# Patient Record
Sex: Female | Born: 1940
Health system: Southern US, Community
[De-identification: ages and names within clinical notes are randomized; demographics above are authoritative.]

## PROBLEM LIST (undated history)

## (undated) DIAGNOSIS — M1990 Unspecified osteoarthritis, unspecified site: Secondary | ICD-10-CM

## (undated) DIAGNOSIS — F339 Major depressive disorder, recurrent, unspecified: Secondary | ICD-10-CM

## (undated) DIAGNOSIS — E039 Hypothyroidism, unspecified: Secondary | ICD-10-CM

## (undated) DIAGNOSIS — J302 Other seasonal allergic rhinitis: Secondary | ICD-10-CM

## (undated) DIAGNOSIS — H409 Unspecified glaucoma: Secondary | ICD-10-CM

## (undated) DIAGNOSIS — I10 Essential (primary) hypertension: Secondary | ICD-10-CM

## (undated) DIAGNOSIS — K219 Gastro-esophageal reflux disease without esophagitis: Secondary | ICD-10-CM

## (undated) DIAGNOSIS — J45909 Unspecified asthma, uncomplicated: Secondary | ICD-10-CM

## (undated) DIAGNOSIS — E785 Hyperlipidemia, unspecified: Secondary | ICD-10-CM

## (undated) DIAGNOSIS — R42 Dizziness and giddiness: Secondary | ICD-10-CM

## (undated) HISTORY — PX: THYROIDECTOMY: SHX17

## (undated) HISTORY — DX: Hyperlipidemia, unspecified: E78.5

## (undated) HISTORY — DX: Dizziness and giddiness: R42

## (undated) HISTORY — PX: MULTIPLE TOOTH EXTRACTIONS: SHX2053

## (undated) HISTORY — DX: Unspecified osteoarthritis, unspecified site: M19.90

## (undated) HISTORY — PX: ABDOMINAL HYSTERECTOMY: SHX81

## (undated) HISTORY — DX: Major depressive disorder, recurrent, unspecified: F33.9

## (undated) HISTORY — DX: Gastro-esophageal reflux disease without esophagitis: K21.9

---

## 1998-12-22 ENCOUNTER — Other Ambulatory Visit: Admission: RE | Admit: 1998-12-22 | Discharge: 1998-12-22 | Payer: Self-pay | Admitting: Gynecology

## 1999-07-03 ENCOUNTER — Encounter: Admission: RE | Admit: 1999-07-03 | Discharge: 1999-07-03 | Payer: Self-pay | Admitting: Gynecology

## 2000-01-02 ENCOUNTER — Encounter: Payer: Self-pay | Admitting: Gynecology

## 2000-01-02 ENCOUNTER — Encounter: Admission: RE | Admit: 2000-01-02 | Discharge: 2000-01-02 | Payer: Self-pay | Admitting: Gynecology

## 2000-02-26 ENCOUNTER — Other Ambulatory Visit: Admission: RE | Admit: 2000-02-26 | Discharge: 2000-02-26 | Payer: Self-pay | Admitting: Gynecology

## 2001-02-12 ENCOUNTER — Encounter: Admission: RE | Admit: 2001-02-12 | Discharge: 2001-02-12 | Payer: Self-pay | Admitting: Gynecology

## 2001-02-12 ENCOUNTER — Encounter: Payer: Self-pay | Admitting: Gynecology

## 2001-03-05 ENCOUNTER — Other Ambulatory Visit: Admission: RE | Admit: 2001-03-05 | Discharge: 2001-03-05 | Payer: Self-pay | Admitting: Gynecology

## 2001-09-08 ENCOUNTER — Emergency Department (HOSPITAL_COMMUNITY): Admission: EM | Admit: 2001-09-08 | Discharge: 2001-09-08 | Payer: Self-pay | Admitting: Emergency Medicine

## 2002-02-15 ENCOUNTER — Encounter: Admission: RE | Admit: 2002-02-15 | Discharge: 2002-02-15 | Payer: Self-pay | Admitting: Gynecology

## 2002-02-15 ENCOUNTER — Encounter: Payer: Self-pay | Admitting: Gynecology

## 2002-02-18 ENCOUNTER — Encounter: Payer: Self-pay | Admitting: Emergency Medicine

## 2002-02-18 ENCOUNTER — Emergency Department (HOSPITAL_COMMUNITY): Admission: EM | Admit: 2002-02-18 | Discharge: 2002-02-18 | Payer: Self-pay | Admitting: Emergency Medicine

## 2003-08-24 ENCOUNTER — Other Ambulatory Visit: Admission: RE | Admit: 2003-08-24 | Discharge: 2003-08-24 | Payer: Self-pay | Admitting: Gynecology

## 2003-09-02 ENCOUNTER — Encounter: Admission: RE | Admit: 2003-09-02 | Discharge: 2003-09-02 | Payer: Self-pay | Admitting: Gynecology

## 2003-09-06 ENCOUNTER — Encounter: Admission: RE | Admit: 2003-09-06 | Discharge: 2003-09-06 | Payer: Self-pay | Admitting: Gynecology

## 2003-11-12 ENCOUNTER — Emergency Department (HOSPITAL_COMMUNITY): Admission: EM | Admit: 2003-11-12 | Discharge: 2003-11-12 | Payer: Self-pay | Admitting: Emergency Medicine

## 2004-09-06 ENCOUNTER — Encounter: Admission: RE | Admit: 2004-09-06 | Discharge: 2004-09-06 | Payer: Self-pay | Admitting: Gynecology

## 2004-09-27 ENCOUNTER — Encounter: Admission: RE | Admit: 2004-09-27 | Discharge: 2004-09-27 | Payer: Self-pay | Admitting: Gynecology

## 2006-03-04 ENCOUNTER — Encounter: Admission: RE | Admit: 2006-03-04 | Discharge: 2006-03-04 | Payer: Self-pay | Admitting: Gynecology

## 2006-03-13 ENCOUNTER — Encounter: Admission: RE | Admit: 2006-03-13 | Discharge: 2006-03-13 | Payer: Self-pay | Admitting: Gynecology

## 2006-09-23 ENCOUNTER — Encounter: Admission: RE | Admit: 2006-09-23 | Discharge: 2006-09-23 | Payer: Self-pay

## 2007-03-27 ENCOUNTER — Encounter: Admission: RE | Admit: 2007-03-27 | Discharge: 2007-03-27 | Payer: Self-pay | Admitting: Internal Medicine

## 2008-04-22 ENCOUNTER — Encounter: Admission: RE | Admit: 2008-04-22 | Discharge: 2008-04-22 | Payer: Self-pay | Admitting: Internal Medicine

## 2009-05-09 ENCOUNTER — Encounter: Admission: RE | Admit: 2009-05-09 | Discharge: 2009-05-09 | Payer: Self-pay | Admitting: Internal Medicine

## 2010-05-06 ENCOUNTER — Encounter: Payer: Self-pay | Admitting: Gynecology

## 2010-05-16 ENCOUNTER — Encounter: Payer: Self-pay | Admitting: Internal Medicine

## 2010-05-28 ENCOUNTER — Other Ambulatory Visit (HOSPITAL_BASED_OUTPATIENT_CLINIC_OR_DEPARTMENT_OTHER): Payer: Self-pay | Admitting: Internal Medicine

## 2010-05-28 DIAGNOSIS — Z1231 Encounter for screening mammogram for malignant neoplasm of breast: Secondary | ICD-10-CM

## 2010-06-14 ENCOUNTER — Ambulatory Visit
Admission: RE | Admit: 2010-06-14 | Discharge: 2010-06-14 | Disposition: A | Discharge status: Home / Self Care | Payer: No Typology Code available for payment source | Source: Ambulatory Visit | Attending: Internal Medicine | Admitting: Internal Medicine

## 2010-06-14 DIAGNOSIS — Z1231 Encounter for screening mammogram for malignant neoplasm of breast: Secondary | ICD-10-CM

## 2010-10-12 DIAGNOSIS — E034 Atrophy of thyroid (acquired): Secondary | ICD-10-CM | POA: Diagnosis present

## 2010-10-12 DIAGNOSIS — I10 Essential (primary) hypertension: Secondary | ICD-10-CM | POA: Diagnosis present

## 2010-10-12 DIAGNOSIS — M1990 Unspecified osteoarthritis, unspecified site: Secondary | ICD-10-CM | POA: Insufficient documentation

## 2011-08-06 ENCOUNTER — Other Ambulatory Visit: Payer: Self-pay | Admitting: Internal Medicine

## 2011-08-06 DIAGNOSIS — Z1231 Encounter for screening mammogram for malignant neoplasm of breast: Secondary | ICD-10-CM

## 2011-08-23 ENCOUNTER — Ambulatory Visit: Payer: No Typology Code available for payment source

## 2011-09-17 ENCOUNTER — Ambulatory Visit
Admission: RE | Admit: 2011-09-17 | Discharge: 2011-09-17 | Disposition: A | Discharge status: Home / Self Care | Payer: No Typology Code available for payment source | Source: Ambulatory Visit | Attending: Internal Medicine | Admitting: Internal Medicine

## 2011-09-17 DIAGNOSIS — Z1231 Encounter for screening mammogram for malignant neoplasm of breast: Secondary | ICD-10-CM

## 2011-09-23 ENCOUNTER — Other Ambulatory Visit: Payer: Self-pay | Admitting: Internal Medicine

## 2011-09-23 DIAGNOSIS — R928 Other abnormal and inconclusive findings on diagnostic imaging of breast: Secondary | ICD-10-CM

## 2011-10-01 ENCOUNTER — Ambulatory Visit
Admission: RE | Admit: 2011-10-01 | Discharge: 2011-10-01 | Disposition: A | Discharge status: Home / Self Care | Payer: Medicare Other | Source: Ambulatory Visit | Attending: Internal Medicine | Admitting: Internal Medicine

## 2011-10-01 DIAGNOSIS — R928 Other abnormal and inconclusive findings on diagnostic imaging of breast: Secondary | ICD-10-CM

## 2012-11-26 ENCOUNTER — Other Ambulatory Visit: Payer: Self-pay

## 2012-11-26 DIAGNOSIS — Z1231 Encounter for screening mammogram for malignant neoplasm of breast: Secondary | ICD-10-CM

## 2012-12-10 ENCOUNTER — Ambulatory Visit
Admission: RE | Admit: 2012-12-10 | Discharge: 2012-12-10 | Disposition: A | Discharge status: Home / Self Care | Payer: Medicare Other | Source: Ambulatory Visit

## 2012-12-10 DIAGNOSIS — Z1231 Encounter for screening mammogram for malignant neoplasm of breast: Secondary | ICD-10-CM

## 2013-10-26 ENCOUNTER — Ambulatory Visit (INDEPENDENT_AMBULATORY_CARE_PROVIDER_SITE_OTHER): Payer: Medicare Other | Admitting: Podiatry

## 2013-10-26 ENCOUNTER — Encounter: Payer: Self-pay | Admitting: Podiatry

## 2013-10-26 VITALS — BP 141/73 | HR 64 | Ht 60.0 in | Wt 229.0 lb

## 2013-10-26 DIAGNOSIS — L600 Ingrowing nail: Secondary | ICD-10-CM

## 2013-10-26 DIAGNOSIS — M79605 Pain in left leg: Secondary | ICD-10-CM | POA: Insufficient documentation

## 2013-10-26 DIAGNOSIS — M79609 Pain in unspecified limb: Secondary | ICD-10-CM

## 2013-10-26 DIAGNOSIS — M79675 Pain in left toe(s): Secondary | ICD-10-CM

## 2013-10-26 DIAGNOSIS — L609 Nail disorder, unspecified: Secondary | ICD-10-CM

## 2013-10-26 DIAGNOSIS — L608 Other nail disorders: Secondary | ICD-10-CM

## 2013-10-26 NOTE — Progress Notes (Signed)
Subjective: Painful ingrown nail x 2-3 years on left lateral big toe. Patient request surgical correction of the toe nail.   Objective: Thick dystrophic nail with ingrown nail border left lateral. Neurovascular status are within normal. No gross deformities in osseous structures.   Assessment: Painful ingrown nail left hallux lateral border.  Plan: Reviewed findings and available treatment options. Patient requests surgical options to prevent further ingrown nail problems.   Procedure done: Phenol and Alcohol Matrixectomy left lateral border.  Affected toe was anesthetized with total 34ml mixture of 50/50 0.5% Marcaine plain and 1% Xylocaine plain. Then left foot was cleansed with Iodine solution. Affected nail border was reflected with a nail elevator and excised with nail nipper. Proximal nail matrix tissue was cauterized with Phenol soaked cotton applicator x 4 and neutralized with Alcohol soaked cotton applicator. The wound was dressed with Amerigel ointment dressing. Home care instructions and supply dispensed.  Return in 1 week for follow up.

## 2013-10-26 NOTE — Patient Instructions (Signed)
Ingrown nail surgery done left lateral border. Follow home care instructions as dispensed.  Return in 1 week for follow up.

## 2013-10-29 ENCOUNTER — Encounter: Payer: Self-pay | Admitting: Podiatry

## 2013-10-29 ENCOUNTER — Ambulatory Visit (INDEPENDENT_AMBULATORY_CARE_PROVIDER_SITE_OTHER): Payer: Medicare Other | Admitting: Podiatry

## 2013-10-29 VITALS — BP 145/73 | HR 74

## 2013-10-29 DIAGNOSIS — Z9889 Other specified postprocedural states: Secondary | ICD-10-CM

## 2013-10-29 NOTE — Progress Notes (Signed)
Status post nail surgery left lateral border of hallux. Still draining without edema or erythema. Informed to continue to soak till the area becomes dry and cover the area during the day.

## 2013-10-29 NOTE — Patient Instructions (Signed)
Doing well following nail surgery. Continue to soak till the area become dry.

## 2013-11-03 DIAGNOSIS — D126 Benign neoplasm of colon, unspecified: Secondary | ICD-10-CM | POA: Insufficient documentation

## 2014-01-28 ENCOUNTER — Encounter (HOSPITAL_COMMUNITY): Payer: Self-pay | Admitting: Emergency Medicine

## 2014-01-28 ENCOUNTER — Emergency Department (HOSPITAL_COMMUNITY): Payer: Medicare Other

## 2014-01-28 ENCOUNTER — Observation Stay (HOSPITAL_COMMUNITY)
Admission: EM | Admit: 2014-01-28 | Discharge: 2014-01-29 | Disposition: A | Discharge status: Home / Self Care | Payer: Medicare Other | Attending: Internal Medicine | Admitting: Internal Medicine

## 2014-01-28 DIAGNOSIS — S065XAA Traumatic subdural hemorrhage with loss of consciousness status unknown, initial encounter: Secondary | ICD-10-CM

## 2014-01-28 DIAGNOSIS — S066X0A Traumatic subarachnoid hemorrhage without loss of consciousness, initial encounter: Principal | ICD-10-CM | POA: Insufficient documentation

## 2014-01-28 DIAGNOSIS — Z79899 Other long term (current) drug therapy: Secondary | ICD-10-CM | POA: Insufficient documentation

## 2014-01-28 DIAGNOSIS — W108XXA Fall (on) (from) other stairs and steps, initial encounter: Secondary | ICD-10-CM | POA: Diagnosis not present

## 2014-01-28 DIAGNOSIS — I10 Essential (primary) hypertension: Secondary | ICD-10-CM | POA: Diagnosis present

## 2014-01-28 DIAGNOSIS — E039 Hypothyroidism, unspecified: Secondary | ICD-10-CM | POA: Diagnosis not present

## 2014-01-28 DIAGNOSIS — J4520 Mild intermittent asthma, uncomplicated: Secondary | ICD-10-CM

## 2014-01-28 DIAGNOSIS — S066X9A Traumatic subarachnoid hemorrhage with loss of consciousness of unspecified duration, initial encounter: Secondary | ICD-10-CM | POA: Diagnosis present

## 2014-01-28 DIAGNOSIS — S0003XA Contusion of scalp, initial encounter: Secondary | ICD-10-CM | POA: Diagnosis not present

## 2014-01-28 DIAGNOSIS — J45909 Unspecified asthma, uncomplicated: Secondary | ICD-10-CM | POA: Diagnosis not present

## 2014-01-28 DIAGNOSIS — Y9239 Other specified sports and athletic area as the place of occurrence of the external cause: Secondary | ICD-10-CM | POA: Diagnosis not present

## 2014-01-28 DIAGNOSIS — R42 Dizziness and giddiness: Secondary | ICD-10-CM | POA: Insufficient documentation

## 2014-01-28 DIAGNOSIS — S066XAA Traumatic subarachnoid hemorrhage with loss of consciousness status unknown, initial encounter: Secondary | ICD-10-CM | POA: Diagnosis present

## 2014-01-28 DIAGNOSIS — Y9301 Activity, walking, marching and hiking: Secondary | ICD-10-CM | POA: Diagnosis not present

## 2014-01-28 DIAGNOSIS — W19XXXA Unspecified fall, initial encounter: Secondary | ICD-10-CM

## 2014-01-28 DIAGNOSIS — S065X9A Traumatic subdural hemorrhage with loss of consciousness of unspecified duration, initial encounter: Secondary | ICD-10-CM

## 2014-01-28 DIAGNOSIS — E034 Atrophy of thyroid (acquired): Secondary | ICD-10-CM | POA: Diagnosis present

## 2014-01-28 DIAGNOSIS — E038 Other specified hypothyroidism: Secondary | ICD-10-CM

## 2014-01-28 HISTORY — DX: Essential (primary) hypertension: I10

## 2014-01-28 HISTORY — DX: Unspecified glaucoma: H40.9

## 2014-01-28 HISTORY — DX: Other seasonal allergic rhinitis: J30.2

## 2014-01-28 HISTORY — DX: Unspecified asthma, uncomplicated: J45.909

## 2014-01-28 HISTORY — DX: Hypothyroidism, unspecified: E03.9

## 2014-01-28 NOTE — ED Notes (Signed)
PA at the bedside.

## 2014-01-28 NOTE — ED Notes (Addendum)
Per EMS, Patient was walking up a grassy hill after the Football game, slipped, and fell backwards. Patient hit the posterior of her head on the cement. Patient reports pain with movement, nausea, and dizziness. Patient was not immobilized due to no signs of spinal injury. Patient awake and alert at arrival to the ED. Vitals per EMS: 174/58, 58-65 HR, NSR, 100% on RA, 124 CBG.

## 2014-01-28 NOTE — ED Provider Notes (Signed)
CSN: [id number]     Arrival date & time 01/28/14  2239 History   First MD Initiated Contact with Patient 01/28/14 2249     Chief Complaint  Patient presents with  . Fall     (Consider location/radiation/quality/duration/timing/severity/associated sxs/prior Treatment) The history is provided by the patient and medical records. No language interpreter was used.    Rebecca Hubbard is a 73 y.o. female  with a hx of HTN, glaucoma, hypothyroid, asthma presents to the Emergency Department complaining of gradual, persistent, progressively worsening dizziness (described as room spinning) onset 40min PTA immediately after falling and striking her head on the concrete. Pt reports she was at a high school football game, walking down a hill when she stepped off the sidewalk into the grass and slipped and fell. Associated symptoms include dizziness, mild headache, mild nausea.  Lying very still makes it better and movement makes it worse.  Pt denies changes in vision, fevers, chills, neck pain, chest pain shortness of breath, abdominal pain, vomiting, loss of consciousness.    PCP: [facility]  Past Medical History  Diagnosis Date  . Hypertension   . Glaucoma   . Hypothyroidism   . Seasonal allergies   . Asthma    Past Surgical History  Procedure Laterality Date  . Thyroidectomy    . Abdominal hysterectomy     Family History  Problem Relation Age of Onset  . Cancer Sister    History  Substance Use Topics  . Smoking status: Never Smoker   . Smokeless tobacco: Never Used  . Alcohol Use: 3.0 oz/week    5 Cans of beer per week   OB History   Grav Para Term Preterm Abortions TAB SAB Ect Mult Living                 Review of Systems  Constitutional: Negative for fever, diaphoresis, appetite change, fatigue and unexpected weight change.  HENT: Negative for mouth sores.   Eyes: Negative for visual disturbance.  Respiratory: Negative for cough, chest tightness, shortness  of breath and wheezing.   Cardiovascular: Negative for chest pain.  Gastrointestinal: Positive for nausea. Negative for vomiting, abdominal pain, diarrhea and constipation.  Endocrine: Negative for polydipsia, polyphagia and polyuria.  Genitourinary: Negative for dysuria, urgency, frequency and hematuria.  Musculoskeletal: Negative for back pain and neck stiffness.  Skin: Negative for rash.  Allergic/Immunologic: Negative for immunocompromised state.  Neurological: Positive for dizziness and headaches. Negative for syncope and light-headedness.  Hematological: Does not bruise/bleed easily.  Psychiatric/Behavioral: Negative for sleep disturbance. The patient is not nervous/anxious.       Allergies  Atorvastatin; Penicillins; Sulfa antibiotics; and Aspirin  Home Medications   Prior to Admission medications   Medication Sig Start Date End Date Taking? Authorizing Provider  albuterol (PROAIR HFA) 108 (90 BASE) MCG/ACT inhaler Inhale 2 puffs into the lungs every 6 (six) hours as needed for wheezing or shortness of breath.  05/30/10  Yes Historical Provider, MD  Bepotastine Besilate (BEPREVE) 1.5 % SOLN Place 1 drop into both eyes every morning. 1.5 % 08/10/08  Yes Historical Provider, MD  calcium carbonate (OS-CAL) 600 MG TABS tablet Take 600 mg by mouth daily.   Yes Historical Provider, MD  GELNIQUE 10 % GEL Apply 1 application topically daily as needed (going to the bathroom as often).  10/04/13  Yes Historical Provider, MD  latanoprost (XALATAN) 0.005 % ophthalmic solution Place 1 drop into both eyes at bedtime.  10/06/13  Yes Historical Provider,  MD  LEVOXYL 125 MCG tablet Take 125 mcg by mouth daily before breakfast.  09/09/13  Yes Historical Provider, MD  mometasone-formoterol (DULERA) 100-5 MCG/ACT AERO Inhale 2 puffs into the lungs 2 (two) times daily.   Yes Historical Provider, MD  triamterene-hydrochlorothiazide (DYAZIDE) 37.5-25 MG per capsule Take 1 capsule by mouth every morning.   07/29/13 07/29/14 Yes Historical Provider, MD  ZETIA 10 MG tablet Take 10 mg by mouth every morning.  10/04/13  Yes Historical Provider, MD   BP 169/74  Pulse 57  Temp(Src) 97.7 F (36.5 C) (Oral)  Resp 22  SpO2 100% Physical Exam  Nursing note and vitals reviewed. Constitutional: She is oriented to person, place, and time. She appears well-developed and well-nourished. No distress.  HENT:  Head: Normocephalic. Head is with contusion.    Right Ear: Tympanic membrane and ear canal normal. No hemotympanum.  Left Ear: Tympanic membrane and ear canal normal. No hemotympanum.  Nose: Nose normal.  Mouth/Throat: Uvula is midline, oropharynx is clear and moist and mucous membranes are normal.  Contusion to the posterior occiput without laceration, step-off or deformity  Eyes: Conjunctivae and EOM are normal. Pupils are equal, round, and reactive to light. No scleral icterus.  No horizontal, vertical or rotational nystagmus  Neck: Normal range of motion. Neck supple. No spinous process tenderness and no muscular tenderness present. No rigidity. Normal range of motion present.  Full active and passive ROM without pain No midline or paraspinal tenderness No nuchal rigidity or meningeal signs  Cardiovascular: Normal rate, regular rhythm, normal heart sounds and intact distal pulses.   No murmur heard. Pulses:      Radial pulses are 2+ on the right side, and 2+ on the left side.       Dorsalis pedis pulses are 2+ on the right side, and 2+ on the left side.       Posterior tibial pulses are 2+ on the right side, and 2+ on the left side.  Pulmonary/Chest: Effort normal and breath sounds normal. No accessory muscle usage. No respiratory distress. She has no decreased breath sounds. She has no wheezes. She has no rhonchi. She has no rales. She exhibits no tenderness and no bony tenderness.  No flail segment, crepitus or deformity Equal chest expansion  Abdominal: Soft. Normal appearance and bowel  sounds are normal. There is no tenderness. There is no rigidity, no rebound, no guarding and no CVA tenderness.  Abd soft and nontender  Musculoskeletal: Normal range of motion.       Thoracic back: She exhibits normal range of motion.       Lumbar back: She exhibits normal range of motion.  Full range of motion of the T-spine and L-spine No tenderness to palpation of the spinous processes of the T-spine or L-spine No tenderness to palpation of the paraspinous muscles of the L-spine  Lymphadenopathy:    She has no cervical adenopathy.  Neurological: She is alert and oriented to person, place, and time. She has normal reflexes. No cranial nerve deficit. She exhibits normal muscle tone. Coordination normal. GCS eye subscore is 4. GCS verbal subscore is 5. GCS motor subscore is 6.  Reflex Scores:      Tricep reflexes are 2+ on the right side and 2+ on the left side.      Bicep reflexes are 2+ on the right side and 2+ on the left side.      Brachioradialis reflexes are 2+ on the right side and 2+ on the  left side.      Patellar reflexes are 2+ on the right side and 2+ on the left side.      Achilles reflexes are 2+ on the right side and 2+ on the left side. Mental Status:  Alert, oriented, thought content appropriate. Speech fluent without evidence of aphasia. Able to follow 2 step commands without difficulty.  Cranial Nerves:  II:  Peripheral visual fields grossly normal, pupils equal, round, reactive to light III,IV, VI: ptosis not present, extra-ocular motions intact bilaterally  V,VII: smile symmetric, facial light touch sensation equal VIII: hearing grossly normal bilaterally  IX,X: gag reflex present  XI: bilateral shoulder shrug equal and strong XII: midline tongue extension  Motor:  5/5 in upper and lower extremities bilaterally including strong and equal grip strength and dorsiflexion/plantar flexion Sensory: Pinprick and light touch normal in all extremities.  Deep Tendon  Reflexes: 2+ and symmetric  Cerebellar: normal finger-to-nose with bilateral upper extremities Gait: Unable to gait test due to severe dizziness CV: distal pulses palpable throughout   Skin: Skin is warm and dry. No rash noted. She is not diaphoretic. No erythema.  Psychiatric: She has a normal mood and affect. Her behavior is normal. Judgment and thought content normal.    ED Course  Procedures (including critical care time) Labs Review Labs Reviewed  BASIC METABOLIC PANEL - Abnormal; Notable for the following:    Sodium 136 (*)    Potassium 3.6 (*)    Glucose, Bld 116 (*)    GFR calc non Af Amer 63 (*)    GFR calc Af Amer 73 (*)    All other components within normal limits  CBC  PROTIME-INR  APTT    Imaging Review Ct Head Wo Contrast  01/29/2014   CLINICAL DATA:  73 year old female post fall hitting head on cement. No history of loss of consciousness. Hypertension. Initial encounter.  EXAM: CT HEAD WITHOUT CONTRAST  CT CERVICAL SPINE WITHOUT CONTRAST  TECHNIQUE: Multidetector CT imaging of the head and cervical spine was performed following the standard protocol without intravenous contrast. Multiplanar CT image reconstructions of the cervical spine were also generated.  COMPARISON:  None.  FINDINGS: CT HEAD FINDINGS  Left parietal scalp small hematoma without underlying fracture.  Question tiny amount of subarachnoid blood right parietal lobe (series 202, image 22).  Partial volume averaging with the roof of the orbit (series 201, image 13)  No CT evidence of large acute infarct.  No intracranial mass lesion noted on this unenhanced exam.  CT CERVICAL SPINE FINDINGS  No cervical spine fracture.  Straightening of the cervical spine may be related to head position or muscle spasm. If ligamentous injury or cord injury were of high clinical concern, MR imaging could be obtained for further delineation.  Cervical spondylotic changes most notable C5-6 level.  Atherosclerotic type changes  aortic arch, great vessels and carotid bifurcation.  IMPRESSION: CT HEAD  Left parietal scalp small hematoma without underlying fracture.  Question tiny amount of subarachnoid blood right parietal lobe (series 202, image 22).  Partial volume averaging with the roof of the orbit (series 201, image 13)  CT CERVICAL SPINE  No cervical spine fracture.  Straightening of the cervical spine may be related to head position or muscle spasm.  Cervical spondylotic changes most notable C5-6 level.  These results were called by telephone at the time of interpretation on 01/29/2014 at 12:23 am to Dr. Abigail Butts , who verbally acknowledged these results.   Electronically Signed  By: Chauncey Cruel M.D.   On: 01/29/2014 00:25   Ct Cervical Spine Wo Contrast  01/29/2014   CLINICAL DATA:  73 year old female post fall hitting head on cement. No history of loss of consciousness. Hypertension. Initial encounter.  EXAM: CT HEAD WITHOUT CONTRAST  CT CERVICAL SPINE WITHOUT CONTRAST  TECHNIQUE: Multidetector CT imaging of the head and cervical spine was performed following the standard protocol without intravenous contrast. Multiplanar CT image reconstructions of the cervical spine were also generated.  COMPARISON:  None.  FINDINGS: CT HEAD FINDINGS  Left parietal scalp small hematoma without underlying fracture.  Question tiny amount of subarachnoid blood right parietal lobe (series 202, image 22).  Partial volume averaging with the roof of the orbit (series 201, image 13)  No CT evidence of large acute infarct.  No intracranial mass lesion noted on this unenhanced exam.  CT CERVICAL SPINE FINDINGS  No cervical spine fracture.  Straightening of the cervical spine may be related to head position or muscle spasm. If ligamentous injury or cord injury were of high clinical concern, MR imaging could be obtained for further delineation.  Cervical spondylotic changes most notable C5-6 level.  Atherosclerotic type changes aortic arch,  great vessels and carotid bifurcation.  IMPRESSION: CT HEAD  Left parietal scalp small hematoma without underlying fracture.  Question tiny amount of subarachnoid blood right parietal lobe (series 202, image 22).  Partial volume averaging with the roof of the orbit (series 201, image 13)  CT CERVICAL SPINE  No cervical spine fracture.  Straightening of the cervical spine may be related to head position or muscle spasm.  Cervical spondylotic changes most notable C5-6 level.  These results were called by telephone at the time of interpretation on 01/29/2014 at 12:23 am to Dr. Abigail Butts , who verbally acknowledged these results.   Electronically Signed   By: Chauncey Cruel M.D.   On: 01/29/2014 00:25     EKG Interpretation None      MDM   Final diagnoses:  Fall, initial encounter  Scalp contusion, initial encounter  Subarachnoid hemorrhage following injury, without loss of consciousness, initial encounter   [facility] presents with headache, mild nausea and severe dizziness (room spinning) after falling and striking her head.  Neurovascularly intact. Patient unable to walk due to severe dizziness. Concern for possible SAH; will CT scan.    1:20 AM CT with questionable tiny amount of subarachnoid blood right parietal lobe.  Increased suspicion of this due to patient symptoms.  Will consult with neurosurgery and plan for admission.   Patient with resolved nausea.  Increasing paraspinal neck pain at this time patient describes as tightness.  Patient continues to deny vision changes.  She reports her headache is resolved.  Dizziness is somewhat improved but returns when looking to the left or with movement   1:26 AM Discussed with Dr. Annette Stable who will evaluate in the morning. Pt to be admitted to medicine.    BP 169/74  Pulse 57  Temp(Src) 97.7 F (36.5 C) (Oral)  Resp 22  SpO2 100%  The patient was discussed with Dr. Lita Mains who agrees with the treatment plan.  2:03  AM Discussed with Dr. Arnoldo Morale who will admit to tele.     Jarrett Soho Jahmani Staup, PA-C 01/29/14 [phone]

## 2014-01-29 ENCOUNTER — Encounter (HOSPITAL_COMMUNITY): Payer: Self-pay | Admitting: Radiology

## 2014-01-29 ENCOUNTER — Observation Stay (HOSPITAL_COMMUNITY): Payer: Medicare Other

## 2014-01-29 DIAGNOSIS — J4520 Mild intermittent asthma, uncomplicated: Secondary | ICD-10-CM

## 2014-01-29 DIAGNOSIS — I10 Essential (primary) hypertension: Secondary | ICD-10-CM

## 2014-01-29 DIAGNOSIS — S0003XA Contusion of scalp, initial encounter: Secondary | ICD-10-CM

## 2014-01-29 DIAGNOSIS — W19XXXA Unspecified fall, initial encounter: Secondary | ICD-10-CM

## 2014-01-29 DIAGNOSIS — S066XAA Traumatic subarachnoid hemorrhage with loss of consciousness status unknown, initial encounter: Secondary | ICD-10-CM | POA: Diagnosis present

## 2014-01-29 DIAGNOSIS — S066X9A Traumatic subarachnoid hemorrhage with loss of consciousness of unspecified duration, initial encounter: Secondary | ICD-10-CM | POA: Diagnosis present

## 2014-01-29 DIAGNOSIS — E038 Other specified hypothyroidism: Secondary | ICD-10-CM

## 2014-01-29 DIAGNOSIS — S066X0A Traumatic subarachnoid hemorrhage without loss of consciousness, initial encounter: Secondary | ICD-10-CM

## 2014-01-29 LAB — CBC
HCT: 35.3 % — ABNORMAL LOW (ref 36.0–46.0)
HEMATOCRIT: 38.8 % (ref 36.0–46.0)
Hemoglobin: 13.6 g/dL (ref 12.0–15.0)
Hemoglobin: 12.6 g/dL (ref 12.0–15.0)
MCH: 30.3 pg (ref 26.0–34.0)
MCH: 30.7 pg (ref 26.0–34.0)
MCHC: 35.1 g/dL (ref 30.0–36.0)
MCHC: 35.7 g/dL (ref 30.0–36.0)
MCV: 86.4 fL (ref 78.0–100.0)
MCV: 86.1 fL (ref 78.0–100.0)
Platelets: 206 10*3/uL (ref 150–400)
Platelets: 205 10*3/uL (ref 150–400)
RBC: 4.49 MIL/uL (ref 3.87–5.11)
RBC: 4.1 MIL/uL (ref 3.87–5.11)
RDW: 13.2 % (ref 11.5–15.5)
RDW: 13.3 % (ref 11.5–15.5)
WBC: 8.2 10*3/uL (ref 4.0–10.5)
WBC: 7.1 10*3/uL (ref 4.0–10.5)

## 2014-01-29 LAB — APTT: APTT: 30 s (ref 24–37)

## 2014-01-29 LAB — BASIC METABOLIC PANEL
ANION GAP: 13 (ref 5–15)
Anion gap: 15 (ref 5–15)
BUN: 15 mg/dL (ref 6–23)
BUN: 13 mg/dL (ref 6–23)
CHLORIDE: 99 meq/L (ref 96–112)
CO2: 25 mEq/L (ref 19–32)
CO2: 26 mEq/L (ref 19–32)
Calcium: 9.1 mg/dL (ref 8.4–10.5)
Calcium: 8.7 mg/dL (ref 8.4–10.5)
Chloride: 96 mEq/L (ref 96–112)
Creatinine, Ser: 0.88 mg/dL (ref 0.50–1.10)
Creatinine, Ser: 0.84 mg/dL (ref 0.50–1.10)
GFR calc Af Amer: 73 mL/min — ABNORMAL LOW (ref >90)
GFR calc Af Amer: 77 mL/min — ABNORMAL LOW (ref >90)
GFR calc non Af Amer: 63 mL/min — ABNORMAL LOW (ref >90)
GFR calc non Af Amer: 66 mL/min — ABNORMAL LOW (ref >90)
GLUCOSE: 116 mg/dL — AB (ref 70–99)
Glucose, Bld: 98 mg/dL (ref 70–99)
POTASSIUM: 3.6 meq/L — AB (ref 3.7–5.3)
POTASSIUM: 3.7 meq/L (ref 3.7–5.3)
Sodium: 136 mEq/L — ABNORMAL LOW (ref 137–147)
Sodium: 138 mEq/L (ref 137–147)

## 2014-01-29 LAB — PROTIME-INR
INR: 1.2 (ref 0.00–1.49)
Prothrombin Time: 15.3 seconds — ABNORMAL HIGH (ref 11.6–15.2)

## 2014-01-29 MED ORDER — ONDANSETRON HCL 4 MG/2ML IJ SOLN: 4.0000 mg | Freq: Four times a day (QID) | INTRAMUSCULAR | Status: DC | PRN

## 2014-01-29 MED ORDER — MOMETASONE FURO-FORMOTEROL FUM 100-5 MCG/ACT IN AERO
2.0000 | INHALATION_SPRAY | Freq: Two times a day (BID) | RESPIRATORY_TRACT | Status: DC
  Administered 2014-01-29: 2 via RESPIRATORY_TRACT
  Filled 2014-01-29: qty 8.8

## 2014-01-29 MED ORDER — ONDANSETRON HCL 4 MG PO TABS: 4.0000 mg | ORAL_TABLET | Freq: Four times a day (QID) | ORAL | Status: DC | PRN

## 2014-01-29 MED ORDER — ONDANSETRON HCL 4 MG/2ML IJ SOLN: 4.0000 mg | Freq: Three times a day (TID) | INTRAMUSCULAR | Status: DC | PRN

## 2014-01-29 MED ORDER — ALUM & MAG HYDROXIDE-SIMETH 200-200-20 MG/5ML PO SUSP: 30.0000 mL | Freq: Four times a day (QID) | ORAL | Status: DC | PRN

## 2014-01-29 MED ORDER — SODIUM CHLORIDE 0.9 % IJ SOLN
3.0000 mL | Freq: Two times a day (BID) | INTRAMUSCULAR | Status: DC
  Administered 2014-01-29: 3 mL via INTRAVENOUS

## 2014-01-29 MED ORDER — CALCIUM CARBONATE 1250 (500 CA) MG PO TABS
1250.0000 mg | ORAL_TABLET | Freq: Every day | ORAL | Status: DC
  Administered 2014-01-29: 1250 mg via ORAL
  Filled 2014-01-29: qty 3

## 2014-01-29 MED ORDER — TRIAMTERENE-HCTZ 37.5-25 MG PO TABS
1.0000 | ORAL_TABLET | Freq: Every day | ORAL | Status: DC
Start: 2014-01-29 — End: 2014-01-29
  Administered 2014-01-29: 1 via ORAL
  Filled 2014-01-29: qty 1

## 2014-01-29 MED ORDER — SODIUM CHLORIDE 0.9 % IJ SOLN: 3.0000 mL | INTRAMUSCULAR | Status: DC | PRN

## 2014-01-29 MED ORDER — TRIAMTERENE-HCTZ 37.5-25 MG PO CAPS
1.0000 | ORAL_CAPSULE | Freq: Every day | ORAL | Status: DC
  Filled 2014-01-29: qty 1

## 2014-01-29 MED ORDER — SODIUM CHLORIDE 0.9 % IJ SOLN
3.0000 mL | Freq: Two times a day (BID) | INTRAMUSCULAR | Status: DC
  Administered 2014-01-29 (×2): 3 mL via INTRAVENOUS

## 2014-01-29 MED ORDER — LEVOTHYROXINE SODIUM 25 MCG PO TABS
125.0000 ug | ORAL_TABLET | Freq: Every day | ORAL | Status: DC
  Administered 2014-01-29: 125 ug via ORAL
  Filled 2014-01-29 (×2): qty 1

## 2014-01-29 MED ORDER — ACETAMINOPHEN 650 MG RE SUPP: 650.0000 mg | Freq: Four times a day (QID) | RECTAL | Status: DC | PRN

## 2014-01-29 MED ORDER — OXYCODONE HCL 5 MG PO TABS: 5.0000 mg | ORAL_TABLET | ORAL | Status: DC | PRN

## 2014-01-29 MED ORDER — ACETAMINOPHEN 325 MG PO TABS: 650.0000 mg | ORAL_TABLET | Freq: Four times a day (QID) | ORAL | Status: DC | PRN

## 2014-01-29 MED ORDER — OLOPATADINE HCL 0.1 % OP SOLN
1.0000 [drp] | Freq: Two times a day (BID) | OPHTHALMIC | Status: DC
  Administered 2014-01-29: 1 [drp] via OPHTHALMIC
  Filled 2014-01-29: qty 5

## 2014-01-29 MED ORDER — EZETIMIBE 10 MG PO TABS
10.0000 mg | ORAL_TABLET | Freq: Every day | ORAL | Status: DC
  Administered 2014-01-29: 10 mg via ORAL
  Filled 2014-01-29: qty 1

## 2014-01-29 MED ORDER — LATANOPROST 0.005 % OP SOLN
1.0000 [drp] | Freq: Every day | OPHTHALMIC | Status: DC
  Filled 2014-01-29: qty 2.5

## 2014-01-29 MED ORDER — SODIUM CHLORIDE 0.9 % IV SOLN: 250.0000 mL | INTRAVENOUS | Status: DC | PRN

## 2014-01-29 MED ORDER — ALBUTEROL SULFATE (2.5 MG/3ML) 0.083% IN NEBU: 3.0000 mL | INHALATION_SOLUTION | Freq: Four times a day (QID) | RESPIRATORY_TRACT | Status: DC | PRN

## 2014-01-29 MED ORDER — HYDROMORPHONE HCL 1 MG/ML IJ SOLN: 0.5000 mg | INTRAMUSCULAR | Status: DC | PRN

## 2014-01-29 NOTE — ED Provider Notes (Signed)
Medical screening examination/treatment/procedure(s) were performed by non-physician practitioner and as supervising physician I was immediately available for consultation/collaboration.   EKG Interpretation None        Julianne Rice, MD 01/29/14 [phone]

## 2014-01-29 NOTE — Progress Notes (Signed)
1550- discharge instructions and prescription given to pt. Pt verbally acknowledged understanding. Pt voiced no questions when prompted. Pt to be transported home by private car by daughter. Nurse to transport pt down to lobby per wheelchair.  No distress noted.    Rebecca Hubbard I 01/29/2014 4:04 PM

## 2014-01-29 NOTE — H&P (Signed)
[facility] Admission History and Physical       [name] [facility] KPT:[id number] DOB: 01-26-1941 DOA: 01/28/2014  Referring physician:  EDP PCP: Margaretmary Eddy, MD  Specialists:   Chief Complaint:  [place] and Hit head  HPI: Rebecca Hubbard is a 73 y.o. female with a history of HTN, Asthma, and Glaucoma  Was walking down the steps of the bleachers at a local high school football game and went from the last step onto the grass and slipped and fell back hitting her head on the concrete steps.   She did not lose consciousness.  She was brought to the ED and a CT scan was performed of the head and C-spine which revealed a small SAH, but no fractures.   Neuro surgery Dr Trenton Gammon was called by the EDP and recommended observation and a  repeat CT scan of the head in the AM and he will see her in the AM.  She was referred for medical admission.      Review of Systems:  Constitutional: No Weight Loss, No Weight Gain, Night Sweats, Fevers, Chills, Dizziness, Fatigue, or Generalized Weakness HEENT:  +Headache, Difficulty Swallowing,Tooth/Dental Problems,Sore Throat,  No Sneezing, Rhinitis, Ear Ache, Nasal Congestion, or Post Nasal Drip,  Cardio-vascular:  No Chest pain, Orthopnea, PND, Edema in Lower Extremities, Anasarca, Dizziness, Palpitations  Resp: No Dyspnea, No DOE, No Productive Cough, No Non-Productive Cough, No Hemoptysis, No Wheezing.    GI: No Heartburn, Indigestion, Abdominal Pain, Nausea, Vomiting, Diarrhea, Hematemesis, Hematochezia, Melena, Change in Bowel Habits,  Loss of Appetite  GU: No Dysuria, Change in Color of Urine, No Urgency or Frequency, No Flank pain.  Musculoskeletal: No Joint Pain or Swelling, No Decreased Range of Motion, No Back Pain.  Neurologic: No Syncope, No Seizures, Muscle Weakness, Paresthesia, Vision Disturbance or Loss, No Diplopia, No Vertigo, No Difficulty Walking,  Skin: No Rash or Lesions. Psych: No Change in Mood or Affect, No Depression  or Anxiety, No Memory loss, No Confusion, or Hallucinations   Past Medical History  Diagnosis Date  . Hypertension   . Glaucoma   . Hypothyroidism   . Seasonal allergies   . Asthma       Past Surgical History  Procedure Laterality Date  . Thyroidectomy    . Abdominal hysterectomy         Prior to Admission medications   Medication Sig Start Date End Date Taking? Authorizing Provider  albuterol (PROAIR HFA) 108 (90 BASE) MCG/ACT inhaler Inhale 2 puffs into the lungs every 6 (six) hours as needed for wheezing or shortness of breath.  05/30/10  Yes Historical Provider, MD  Bepotastine Besilate (BEPREVE) 1.5 % SOLN Place 1 drop into both eyes every morning. 1.5 % 08/10/08  Yes Historical Provider, MD  calcium carbonate (OS-CAL) 600 MG TABS tablet Take 600 mg by mouth daily.   Yes Historical Provider, MD  GELNIQUE 10 % GEL Apply 1 application topically daily as needed (going to the bathroom as often).  10/04/13  Yes Historical Provider, MD  latanoprost (XALATAN) 0.005 % ophthalmic solution Place 1 drop into both eyes at bedtime.  10/06/13  Yes Historical Provider, MD  LEVOXYL 125 MCG tablet Take 125 mcg by mouth daily before breakfast.  09/09/13  Yes Historical Provider, MD  mometasone-formoterol (DULERA) 100-5 MCG/ACT AERO Inhale 2 puffs into the lungs 2 (two) times daily.   Yes Historical Provider, MD  triamterene-hydrochlorothiazide (DYAZIDE) 37.5-25 MG per capsule Take 1 capsule by mouth every morning.  07/29/13 07/29/14 Yes Historical  Provider, MD  ZETIA 10 MG tablet Take 10 mg by mouth every morning.  10/04/13  Yes Historical Provider, MD      Allergies  Allergen Reactions  . Atorvastatin Other (See Comments)  . Penicillins Hives  . Sulfa Antibiotics Other (See Comments)    Other Reaction: Other reaction  . Aspirin Rash     Social History:  reports that she has never smoked. She has never used smokeless tobacco. She reports that she drinks about 3 ounces of alcohol per week.  She reports that she does not use illicit drugs.     Family History  Problem Relation Age of Onset  . Cancer Sister        Physical Exam:  GEN:  Pleasant Elderly  73 y.o. [place] American female  examined  and in no acute distress; cooperative with exam Filed Vitals:   01/29/14 0215 01/29/14 0325 01/29/14 0400 01/29/14 0600  BP: 153/77 166/52 179/53 139/44  Pulse: 59 58 58 60  Temp:  97.6 F (36.4 C)    TempSrc:  Oral    Resp: 14 18 18 16   Height:  5' (1.524 m)    Weight:  103.148 kg (227 lb 6.4 oz)    SpO2: 99% 99% 99%    Blood pressure 139/44, pulse 60, temperature 97.6 F (36.4 C), temperature source Oral, resp. rate 16, height 5' (1.524 m), weight 103.148 kg (227 lb 6.4 oz), SpO2 99.00%. PSYCH: SHe is alert and oriented x4; does not appear anxious does not appear depressed; affect is normal HEENT: Normocephalic and Atraumatic + Tenderness on Left Posterior Scalp area, Mucous membranes pink; PERRLA; EOM intact; Fundi:  Benign;  No scleral icterus, Nares: Patent, Oropharynx: Clear, Fair Dentition,    Neck:  FROM, No Cervical Lymphadenopathy nor Thyromegaly or Carotid Bruit; No JVD; Breasts:: Not examined CHEST WALL: No tenderness CHEST: Normal respiration, clear to auscultation bilaterally HEART: Regular rate and rhythm; no murmurs rubs or gallops BACK: No kyphosis or scoliosis; No CVA tenderness ABDOMEN: Positive Bowel Sounds, Obese, Soft Non-Tender; No Masses, No Organomegaly. Rectal Exam: Not done EXTREMITIES: No Cyanosis, Clubbing, or Edema; No Ulcerations. Genitalia: not examined PULSES: 2+ and symmetric SKIN: Normal hydration no rash or ulceration  CNS:   Mental Status:  Alert, Oriented, Thought Content Appropriate. Speech Fluent without evidence of Aphasia. Able to follow 3 step commands without difficulty.  In No obvious pain.   Cranial Nerves:  II: Discs flat bilaterally; Visual fields Intact, Pupils equal and reactive.    III,IV, VI: Extra-ocular motions  intact bilaterally    V,VII: smile symmetric, facial light touch sensation normal bilaterally    VIII: hearing intact or decreasesd bilaterally    IX,X: gag reflex present    XI: bilateral shoulder shrug    XII: midline tongue extension   Motor:  Right:  Upper extremity 5/5     Left:  Upper extremity 5/5     Right:  Lower extremity 5/5    Left:  Lower extremity 5/5     Tone and Bulk:  normal tone throughout; no atrophy noted   Sensory:  Pinprick and light touch intact throughout, bilaterally   Deep Tendon Reflexes: 2+ and symmetric throughout   Plantars/ Babinski:  Right: normal Left: normal    Cerebellar:  Finger to nose without difficulty.   Gait: deferred   Vascular: pulses palpable throughout    Labs on Admission:  Basic Metabolic Panel:  Recent Labs Lab 01/29/14 0103 01/29/14 0444  NA 136* 138  K 3.6* 3.7  CL 96 99  CO2 25 26  GLUCOSE 116* 98  BUN 15 13  CREATININE 0.88 0.84  CALCIUM 9.1 8.7   Liver Function Tests: No results found for this basename: AST, ALT, ALKPHOS, BILITOT, PROT, ALBUMIN,  in the last 168 hours No results found for this basename: LIPASE, AMYLASE,  in the last 168 hours No results found for this basename: AMMONIA,  in the last 168 hours CBC:  Recent Labs Lab 01/29/14 0103 01/29/14 0444  WBC 8.2 7.1  HGB 13.6 12.6  HCT 38.8 35.3*  MCV 86.4 86.1  PLT 206 205   Cardiac Enzymes: No results found for this basename: CKTOTAL, CKMB, CKMBINDEX, TROPONINI,  in the last 168 hours  BNP (last 3 results) No results found for this basename: PROBNP,  in the last 8760 hours CBG: No results found for this basename: GLUCAP,  in the last 168 hours  Radiological Exams on Admission: Ct Head Wo Contrast  01/29/2014   CLINICAL DATA:  73 year old female post fall hitting head on cement. No history of loss of consciousness. Hypertension. Initial encounter.  EXAM: CT HEAD WITHOUT CONTRAST  CT CERVICAL SPINE WITHOUT CONTRAST  TECHNIQUE: Multidetector CT  imaging of the head and cervical spine was performed following the standard protocol without intravenous contrast. Multiplanar CT image reconstructions of the cervical spine were also generated.  COMPARISON:  None.  FINDINGS: CT HEAD FINDINGS  Left parietal scalp small hematoma without underlying fracture.  Question tiny amount of subarachnoid blood right parietal lobe (series 202, image 22).  Partial volume averaging with the roof of the orbit (series 201, image 13)  No CT evidence of large acute infarct.  No intracranial mass lesion noted on this unenhanced exam.  CT CERVICAL SPINE FINDINGS  No cervical spine fracture.  Straightening of the cervical spine may be related to head position or muscle spasm. If ligamentous injury or cord injury were of high clinical concern, MR imaging could be obtained for further delineation.  Cervical spondylotic changes most notable C5-6 level.  Atherosclerotic type changes aortic arch, great vessels and carotid bifurcation.  IMPRESSION: CT HEAD  Left parietal scalp small hematoma without underlying fracture.  Question tiny amount of subarachnoid blood right parietal lobe (series 202, image 22).  Partial volume averaging with the roof of the orbit (series 201, image 13)  CT CERVICAL SPINE  No cervical spine fracture.  Straightening of the cervical spine may be related to head position or muscle spasm.  Cervical spondylotic changes most notable C5-6 level.  These results were called by telephone at the time of interpretation on 01/29/2014 at 12:23 am to Dr. Abigail Butts , who verbally acknowledged these results.   Electronically Signed   By: Chauncey Cruel M.D.   On: 01/29/2014 00:25   Ct Cervical Spine Wo Contrast  01/29/2014   CLINICAL DATA:  73 year old female post fall hitting head on cement. No history of loss of consciousness. Hypertension. Initial encounter.  EXAM: CT HEAD WITHOUT CONTRAST  CT CERVICAL SPINE WITHOUT CONTRAST  TECHNIQUE: Multidetector CT imaging of  the head and cervical spine was performed following the standard protocol without intravenous contrast. Multiplanar CT image reconstructions of the cervical spine were also generated.  COMPARISON:  None.  FINDINGS: CT HEAD FINDINGS  Left parietal scalp small hematoma without underlying fracture.  Question tiny amount of subarachnoid blood right parietal lobe (series 202, image 22).  Partial volume averaging with the roof of the orbit (series 201, image 13)  No CT evidence of large acute infarct.  No intracranial mass lesion noted on this unenhanced exam.  CT CERVICAL SPINE FINDINGS  No cervical spine fracture.  Straightening of the cervical spine may be related to head position or muscle spasm. If ligamentous injury or cord injury were of high clinical concern, MR imaging could be obtained for further delineation.  Cervical spondylotic changes most notable C5-6 level.  Atherosclerotic type changes aortic arch, great vessels and carotid bifurcation.  IMPRESSION: CT HEAD  Left parietal scalp small hematoma without underlying fracture.  Question tiny amount of subarachnoid blood right parietal lobe (series 202, image 22).  Partial volume averaging with the roof of the orbit (series 201, image 13)  CT CERVICAL SPINE  No cervical spine fracture.  Straightening of the cervical spine may be related to head position or muscle spasm.  Cervical spondylotic changes most notable C5-6 level.  These results were called by telephone at the time of interpretation on 01/29/2014 at 12:23 am to Dr. Abigail Butts , who verbally acknowledged these results.   Electronically Signed   By: Chauncey Cruel M.D.   On: 01/29/2014 00:25      Assessment/Plan:   73 y.o. female with  Principal Problem:   1.   Subarachnoid hemorrhage following injury- due to Fall   Observation, and Cardiac Monitoring   Neuro checks   Repeat CT scan of the Head in AM   Neuro surgery Dr. Trenton Gammon to see in AM.      Pain Control PRN   Fall  Precautions    Active Problems:   2.   Scalp contusion - due to Fall   Pain Control PRN        3.   Mechanical Fall   Fall Precautions     4.   Essential hypertension, benign   Continue HCTZ daily   Monitor BPs     5.   Extrinsic asthma   Continue Inhalers PRN     6.   Hypothyroidism   Continue Levoxyl   Check TSH Level.       7.   DVT Prophylaxis   SCDs    Code Status:   FULL CODE    Family Communication:    Daughter at Bedside Disposition Plan:      Observation   Time spent:  [place] [facility] Pager [phone]   If [place] Please Contact the Day Rounding Team MD for [facility]  If 7PM-7AM, Please Contact Night-Floor Coverage  [email or web address] Password [email or web address] 01/29/2014, 6:22 AM

## 2014-01-29 NOTE — ED Notes (Signed)
PA [place] at  bedside.

## 2014-01-29 NOTE — Discharge Summary (Signed)
Physician Discharge Summary  Evonne Rinks [facility] JXB:[id number] DOB: February 17, 1941 DOA: 01/28/2014  PCP: Margaretmary Eddy, MD  Admit date: 01/28/2014 Discharge date: 01/29/2014  Time spent: 35 minutes  Recommendations for Outpatient Follow-up:  1. Patient admitted after having a fall that resulted in head injury. Initial CT scan of head showed a possible tiny subarachnoid bleed, with repeat CT on the following day not well seen as reported by radiology. Patient neurologically stable.  2. Patient instructed to follow up with her PCP in 1 week.   Discharge Diagnoses:  Principal Problem:   Subarachnoid hemorrhage following injury Active Problems:   Scalp contusion   Essential hypertension, benign   Extrinsic asthma   Hypothyroidism   Discharge Condition: Stable  Diet recommendation: Heart Healthy  Filed Weights   01/29/14 0325  Weight: 103.148 kg (227 lb 6.4 oz)    History of present illness:  Rebecca Hubbard is a 73 y.o. female with a history of HTN, Asthma, and Glaucoma Was walking down the steps of the bleachers at a local high school football game and went from the last step onto the grass and slipped and fell back hitting her head on the concrete steps. She did not lose consciousness. She was brought to the ED and a CT scan was performed of the head and C-spine which revealed a small SAH, but no fractures. Neuro surgery Dr Trenton Gammon was called by the EDP and recommended observation and a repeat CT scan of the head in the AM and he will see her in the AM. She was referred for medical admission.   Hospital Course:  Patient is a pleasant 73-year-old female with a past medical history of hypertension, who was in her usual state of health having a fall while walking down the steps of the bleachers at a local high school football game. She hit the back of her head on concrete steps, there was no loss of consciousness.in the emergency room a CT scan of head showed a questionable tiny  amount of subarachnoid blood in the parietal lobe. She was kept overnight for observation. Patient remained neurologically intact with repeat CT scan of head on the following day remaining stable. Radiology reported that the possible focus of subarachnoid hemorrhage in the right parietal lobe was not well seen on repeat imaging. Given clinical stability she was discharged to her home on 01/29/2014.   Discharge Exam: Filed Vitals:   01/29/14 1414  BP: 137/53  Pulse: 50  Temp: 97.7 F (36.5 C)  Resp: 18    General: patient is in no acute distress, awake and oriented, states feeling better, asking to go home Cardiovascular: Regular rate and rhythm normal S1-S2 no murmurs rubs or gallops Respiratory: Normal respiratory effort, lungs are clear to auscultation bilateral Neurological: Patient is awake alert and oriented x3, global 5 of 5 muscle strength no alteration to sensation  Discharge Instructions You were cared for by a hospitalist during your hospital stay. If you have any questions about your discharge medications or the care you received while you were in the hospital after you are discharged, you can call the unit and asked to speak with the hospitalist on call if the hospitalist that took care of you is not available. Once you are discharged, your primary care physician will handle any further medical issues. Please note that NO REFILLS for any discharge medications will be authorized once you are discharged, as it is imperative that you return to your primary care physician (or establish a relationship with  a primary care physician if you do not have one) for your aftercare needs so that they can reassess your need for medications and monitor your lab values.  Discharge Instructions   Call MD for:  difficulty breathing, headache or visual disturbances    Complete by:  As directed      Call MD for:  extreme fatigue    Complete by:  As directed      Call MD for:  hives    Complete by:   As directed      Call MD for:  persistant dizziness or light-headedness    Complete by:  As directed      Call MD for:  persistant nausea and vomiting    Complete by:  As directed      Call MD for:  redness, tenderness, or signs of infection (pain, swelling, redness, odor or green/yellow discharge around incision site)    Complete by:  As directed      Call MD for:  severe uncontrolled pain    Complete by:  As directed      Call MD for:  temperature >100.4    Complete by:  As directed      Diet - low sodium heart healthy    Complete by:  As directed      Increase activity slowly    Complete by:  As directed           Current Discharge Medication List    START taking these medications   Details  acetaminophen (TYLENOL) 325 MG tablet Take 2 tablets (650 mg total) by mouth every 6 (six) hours as needed for mild pain (or Fever >/= 101). Qty: 30 tablet, Refills: 0    ondansetron (ZOFRAN) 4 MG tablet Take 1 tablet (4 mg total) by mouth every 6 (six) hours as needed for nausea. Qty: 20 tablet, Refills: 0      CONTINUE these medications which have NOT CHANGED   Details  albuterol (PROAIR HFA) 108 (90 BASE) MCG/ACT inhaler Inhale 2 puffs into the lungs every 6 (six) hours as needed for wheezing or shortness of breath.     Bepotastine Besilate (BEPREVE) 1.5 % SOLN Place 1 drop into both eyes every morning. 1.5 %    calcium carbonate (OS-CAL) 600 MG TABS tablet Take 600 mg by mouth daily.    GELNIQUE 10 % GEL Apply 1 application topically daily as needed (going to the bathroom as often).     latanoprost (XALATAN) 0.005 % ophthalmic solution Place 1 drop into both eyes at bedtime.     LEVOXYL 125 MCG tablet Take 125 mcg by mouth daily before breakfast.     mometasone-formoterol (DULERA) 100-5 MCG/ACT AERO Inhale 2 puffs into the lungs 2 (two) times daily.    triamterene-hydrochlorothiazide (DYAZIDE) 37.5-25 MG per capsule Take 1 capsule by mouth every morning.     ZETIA 10 MG  tablet Take 10 mg by mouth every morning.        Allergies  Allergen Reactions  . Atorvastatin Other (See Comments)  . Penicillins Hives  . Sulfa Antibiotics Other (See Comments)    Other Reaction: Other reaction  . Aspirin Rash   Follow-up Information   Follow up with PATERSON,ROBERT, MD In 1 week.   Specialty:  Internal Medicine   Contact information:   [address] [place] [address] [phone]        The results of significant diagnostics from this hospitalization (including imaging, microbiology, ancillary and  laboratory) are listed below for reference.    Significant Diagnostic Studies: Ct Head Wo Contrast  01/29/2014   CLINICAL DATA:  Subdural hemorrhage.  EXAM: CT HEAD WITHOUT CONTRAST  TECHNIQUE: Contiguous axial images were obtained from the base of the skull through the vertex without intravenous contrast.  COMPARISON:  Head CT scan 01/28/2014.  FINDINGS: Possible small focus of subarachnoid hemorrhage in the right parietal lobe seen on the comparison study is less conspicuous. No new hemorrhage is identified. There is no evidence of acute infarction, mass lesion, mass effect, midline shift or abnormal extra-axial fluid collection. No hydrocephalus or pneumocephalus. The calvarium is intact.  IMPRESSION: Possible small focus of subarachnoid hemorrhage in the right parietal lobe on yesterday's examination is not well seen today. No acute finding is identified.   Electronically Signed   By: Inge Rise M.D.   On: 01/29/2014 14:06   Ct Head Wo Contrast  01/29/2014   CLINICAL DATA:  73 year old female post fall hitting head on cement. No history of loss of consciousness. Hypertension. Initial encounter.  EXAM: CT HEAD WITHOUT CONTRAST  CT CERVICAL SPINE WITHOUT CONTRAST  TECHNIQUE: Multidetector CT imaging of the head and cervical spine was performed following the standard protocol without intravenous contrast. Multiplanar CT image reconstructions of  the cervical spine were also generated.  COMPARISON:  None.  FINDINGS: CT HEAD FINDINGS  Left parietal scalp small hematoma without underlying fracture.  Question tiny amount of subarachnoid blood right parietal lobe (series 202, image 22).  Partial volume averaging with the roof of the orbit (series 201, image 13)  No CT evidence of large acute infarct.  No intracranial mass lesion noted on this unenhanced exam.  CT CERVICAL SPINE FINDINGS  No cervical spine fracture.  Straightening of the cervical spine may be related to head position or muscle spasm. If ligamentous injury or cord injury were of high clinical concern, MR imaging could be obtained for further delineation.  Cervical spondylotic changes most notable C5-6 level.  Atherosclerotic type changes aortic arch, great vessels and carotid bifurcation.  IMPRESSION: CT HEAD  Left parietal scalp small hematoma without underlying fracture.  Question tiny amount of subarachnoid blood right parietal lobe (series 202, image 22).  Partial volume averaging with the roof of the orbit (series 201, image 13)  CT CERVICAL SPINE  No cervical spine fracture.  Straightening of the cervical spine may be related to head position or muscle spasm.  Cervical spondylotic changes most notable C5-6 level.  These results were called by telephone at the time of interpretation on 01/29/2014 at 12:23 am to Dr. Abigail Butts , who verbally acknowledged these results.   Electronically Signed   By: Chauncey Cruel M.D.   On: 01/29/2014 00:25   Ct Cervical Spine Wo Contrast  01/29/2014   CLINICAL DATA:  74 year old female post fall hitting head on cement. No history of loss of consciousness. Hypertension. Initial encounter.  EXAM: CT HEAD WITHOUT CONTRAST  CT CERVICAL SPINE WITHOUT CONTRAST  TECHNIQUE: Multidetector CT imaging of the head and cervical spine was performed following the standard protocol without intravenous contrast. Multiplanar CT image reconstructions of the cervical  spine were also generated.  COMPARISON:  None.  FINDINGS: CT HEAD FINDINGS  Left parietal scalp small hematoma without underlying fracture.  Question tiny amount of subarachnoid blood right parietal lobe (series 202, image 22).  Partial volume averaging with the roof of the orbit (series 201, image 13)  No CT evidence of large acute infarct.  No intracranial  mass lesion noted on this unenhanced exam.  CT CERVICAL SPINE FINDINGS  No cervical spine fracture.  Straightening of the cervical spine may be related to head position or muscle spasm. If ligamentous injury or cord injury were of high clinical concern, MR imaging could be obtained for further delineation.  Cervical spondylotic changes most notable C5-6 level.  Atherosclerotic type changes aortic arch, great vessels and carotid bifurcation.  IMPRESSION: CT HEAD  Left parietal scalp small hematoma without underlying fracture.  Question tiny amount of subarachnoid blood right parietal lobe (series 202, image 22).  Partial volume averaging with the roof of the orbit (series 201, image 13)  CT CERVICAL SPINE  No cervical spine fracture.  Straightening of the cervical spine may be related to head position or muscle spasm.  Cervical spondylotic changes most notable C5-6 level.  These results were called by telephone at the time of interpretation on 01/29/2014 at 12:23 am to Dr. Abigail Butts , who verbally acknowledged these results.   Electronically Signed   By: Chauncey Cruel M.D.   On: 01/29/2014 00:25    Microbiology: No results found for this or any previous visit (from the past 240 hour(s)).   Labs: Basic Metabolic Panel:  Recent Labs Lab 01/29/14 0103 01/29/14 0444  NA 136* 138  K 3.6* 3.7  CL 96 99  CO2 25 26  GLUCOSE 116* 98  BUN 15 13  CREATININE 0.88 0.84  CALCIUM 9.1 8.7   Liver Function Tests: No results found for this basename: AST, ALT, ALKPHOS, BILITOT, PROT, ALBUMIN,  in the last 168 hours No results found for this  basename: LIPASE, AMYLASE,  in the last 168 hours No results found for this basename: AMMONIA,  in the last 168 hours CBC:  Recent Labs Lab 01/29/14 0103 01/29/14 0444  WBC 8.2 7.1  HGB 13.6 12.6  HCT 38.8 35.3*  MCV 86.4 86.1  PLT 206 205   Cardiac Enzymes: No results found for this basename: CKTOTAL, CKMB, CKMBINDEX, TROPONINI,  in the last 168 hours BNP: BNP (last 3 results) No results found for this basename: PROBNP,  in the last 8760 hours CBG: No results found for this basename: GLUCAP,  in the last 168 hours     Signed:  Kelvin Cellar  [facility] 01/29/2014, 3:43 PM

## 2014-02-01 ENCOUNTER — Encounter: Payer: Self-pay | Admitting: Neurology

## 2014-02-01 ENCOUNTER — Ambulatory Visit (INDEPENDENT_AMBULATORY_CARE_PROVIDER_SITE_OTHER): Payer: Medicare Other | Admitting: Neurology

## 2014-02-01 VITALS — BP 155/70 | HR 66 | Temp 98.2°F | Ht 60.0 in | Wt 227.6 lb

## 2014-02-01 DIAGNOSIS — S060X0A Concussion without loss of consciousness, initial encounter: Secondary | ICD-10-CM

## 2014-02-01 DIAGNOSIS — R42 Dizziness and giddiness: Secondary | ICD-10-CM

## 2014-02-01 NOTE — Progress Notes (Signed)
[facility]  Provider:  Dr Jaynee Eagles Referring Provider: Margaretmary Eddy, MD Primary Care Physician:  Margaretmary Eddy, MD  CC:  Dizziness and recent fall  HPI:  Rebecca Hubbard is a lovely 73 y.o. female here as a referral from Dr. Philip Aspen for subarachnoid bleed. PMHx of HTN, Asthma, and Glaucoma.   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 Was walking down the steps of the bleachers at a local high school football game and went from the last step onto the grass and slipped and fell back hitting her head on the concrete steps. She did not lose consciousness. She went to the ED.  It was bad, daughter heard her head strike the surface. She was very confused afterwards and dizzy. Denied nausea or vomiting afterwards. Confusion has resolved. Now still dizzy frequently and happens when she stands, walks, sneezes or even when she is on the phone. Denies feeling Light headed, no room spinning, no vision loss or blurry vision, no focal neurologic symptoms, no nausea. If she eats anything she gets gerd. Neck is still sore. No headache since Sunday. When she talks on the phone, she gets a little dizzy after 5 minutes. If on a speaker and don't look down is ok. No dizziness before the fall. The dizziness is improving daily. Accompanied by daughter who gives much of the history.  Reviewed notes, labs and imaging from outside physicians, which showed: Patient admitted overnight for monitoring and discharged in the morning. Personally reviewed images and CAT scan of the head showed 2 small areas of possible subarachnoid blood that was not there on repeat CT. CT of the neck did not show any fractures. BMP and cbc unremarkable.   Review of Systems: Patient complains of symptoms per HPI as well as the following symptoms dizziness, no chest pain, neck pain, headache, cough. Pertinent negatives per HPI. All others negative.   History    Social History  . Marital Status: Widowed    Spouse Name: N/A    Number of Children: 2  . Years of Education: College   Occupational History  . retired Other   Social History Main Topics  . Smoking status: Never Smoker   . Smokeless tobacco: Never Used  . Alcohol Use: 3.0 oz/week    5 Cans of beer per week  . Drug Use: No  . Sexual Activity: Not on file   Other Topics Concern  . Not on file   Social History Narrative   Patient lives at home with daughter.   Caffeine Use: 1.5 cup daily    Family History  Problem Relation Age of Onset  . Cancer Sister   . Stroke Mother   . Alzheimer's disease Father     Past Medical History  Diagnosis Date  . Hypertension   . Glaucoma   . Hypothyroidism   . Seasonal allergies   . Asthma     Past Surgical History  Procedure Laterality Date  . Thyroidectomy    . Abdominal hysterectomy      Current Outpatient Prescriptions  Medication Sig Dispense Refill  . acetaminophen (TYLENOL) 325 MG tablet Take 2 tablets (650 mg total) by mouth every 6 (six) hours as needed for mild pain (or Fever >/= 101).  30 tablet  0  . albuterol (PROAIR HFA) 108 (90 BASE) MCG/ACT inhaler Inhale 2 puffs into the lungs every 6 (six) hours as needed for wheezing or shortness of breath.       Marland Kitchen  allopurinol (ZYLOPRIM) 100 MG tablet Take 1 tablet by mouth as needed.      . Bepotastine Besilate (BEPREVE) 1.5 % SOLN Place 1 drop into both eyes every morning. 1.5 %      . calcium carbonate (OS-CAL) 600 MG TABS tablet Take 600 mg by mouth daily.      Marland Kitchen GELNIQUE 10 % GEL Apply 1 application topically daily as needed (going to the bathroom as often).       Marland Kitchen latanoprost (XALATAN) 0.005 % ophthalmic solution Place 1 drop into both eyes at bedtime.       Marland Kitchen LEVOXYL 125 MCG tablet Take 125 mcg by mouth daily before breakfast.       . mometasone-formoterol (DULERA) 100-5 MCG/ACT AERO Inhale 2 puffs into the lungs 2 (two) times daily.      . ondansetron (ZOFRAN) 4 MG  tablet Take 1 tablet (4 mg total) by mouth every 6 (six) hours as needed for nausea.  20 tablet  0  . triamterene-hydrochlorothiazide (DYAZIDE) 37.5-25 MG per capsule Take 1 capsule by mouth every morning.       Marland Kitchen ZETIA 10 MG tablet Take 10 mg by mouth every morning.        No current facility-administered medications for this visit.    Allergies as of 02/01/2014 - Review Complete 02/01/2014  Allergen Reaction Noted  . Atorvastatin Other (See Comments) 10/26/2013  . Penicillins Hives 10/26/2013  . Sulfa antibiotics Other (See Comments) 10/26/2013  . Aspirin Rash 01/28/2014    Vitals: BP 155/70  Pulse 66  Temp(Src) 98.2 F (36.8 C) (Oral)  Ht 5' (1.524 m)  Wt 227 lb 9.6 oz (103.239 kg)  BMI 44.45 kg/m2 Last Weight:  Wt Readings from Last 1 Encounters:  02/01/14 227 lb 9.6 oz (103.239 kg)   Last Height:   Ht Readings from Last 1 Encounters:  02/01/14 5' (1.524 m)   Physical exam: Exam: Gen: NAD, conversant, well nourised, obese, well groomed                     CV: RRR, no MRG. No Carotid Bruits. No peripheral edema, warm, nontender Eyes: Conjunctivae clear without exudates or hemorrhage  Neuro: Detailed Neurologic Exam  Speech:    Speech is normal; fluent and spontaneous with normal comprehension.  Cognition:    The patient is oriented to person, place, and time;     recent and remote memory intact;     language fluent;     normal attention, concentration,     fund of knowledge Cranial Nerves:    The pupils are equal, round, and reactive to light. The fundi are normal and spontaneous venous pulsations are present. Visual fields are full to finger confrontation. Extraocular movements are intact. Trigeminal sensation is intact and the muscles of mastication are normal. The face is symmetric. The palate elevates in the midline. Voice is normal. Shoulder shrug is normal. The tongue has normal motion without fasciculations.   Coordination:    Normal finger to nose and  heel to shin. Normal rapid alternating movements.   Gait:    Good stride, good arm swing, normal base  Motor Observation:    No asymmetry, no atrophy, and no involuntary movements noted. Tone:    Normal muscle tone.    Posture:    Posture is normal.     Strength:    Strength is intact in the upper and lower limbs.      Sensation: intact  Reflex Exam:  DTR's:    Absent achilles. Otherwise deep tendon reflexes in the upper and lower extremities are normal bilaterally.   Toes:    The toes are equivocal bilaterally.   Clonus:    Clonus is absent.       Assessment/Plan:   Lovely 73 y.o.active female here as a referral from Dr. Philip Aspen for subarachnoid bleed. PMHx of HTN, Asthma, and Glaucoma.  Friday she tripped and fell and hit her head.  She did not lose consciousness.  She was very confused afterwards and dizzy. Denied nausea or vomiting afterwards. Confusion has resolved. Now still dizzy frequently and happens when she stands, walks, sneezes or even when she is on the phone. Denies feeling Light headed, no room spinning, no headache, no vision loss or blurry vision, no focal neurologic symptoms, no nausea. CAT scan of the head showed 2 small areas of possible subarachnoid blood that was not there on repeat CT. CT of the neck did not show any fractures. Dizzyness is improving daily. Neuro exam is non-focal.  This is likely as a result of head injury and/or post-concussive symptom. It is improving so no intervention needed. Suggest using walking aid due to fall risk, get up slowly, don't move head quickly. Mist important is to be vigilant for repeat falls. Rest, slowly return to normal activities. Rest with recurrence of symptoms. If symptoms worsen or patient becomes confused or for any other concerning symptom, call 911 or go back to emergency room.    Sarina Ill, MD  [facility] [address] [place], [place] [address]  Phone  [phone] Fax [phone]

## 2014-02-01 NOTE — Patient Instructions (Signed)
Overall you are doing fairly well but I do want to suggest a few things today:   Remember to drink plenty of fluid, eat healthy meals and do not skip any meals. Try to eat protein with a every meal and eat a healthy snack such as fruit or nuts in between meals. Try to keep a regular sleep-wake schedule and try to exercise daily, particularly in the form of walking, 20-30 minutes a day, if you can.   I would like to see you back if needed, sooner if we need to. Please call us with any interim questions, concerns, problems, updates or refill requests.   Please also call us for any test results so we can go over those with you on the phone.  My clinical assistant and will answer any of your questions and relay your messages to me and also relay most of my messages to you.   Our phone number is [phone]. We also have an after hours call service for urgent matters and there is a physician on-call for urgent questions. For any emergencies you know to call 911 or go to the nearest emergency room

## 2014-02-02 ENCOUNTER — Encounter: Payer: Self-pay | Admitting: Neurology

## 2014-02-02 DIAGNOSIS — S060X0A Concussion without loss of consciousness, initial encounter: Secondary | ICD-10-CM | POA: Insufficient documentation

## 2014-02-02 DIAGNOSIS — R42 Dizziness and giddiness: Secondary | ICD-10-CM | POA: Insufficient documentation

## 2014-03-04 ENCOUNTER — Other Ambulatory Visit: Payer: Self-pay

## 2014-03-04 DIAGNOSIS — Z1231 Encounter for screening mammogram for malignant neoplasm of breast: Secondary | ICD-10-CM

## 2014-03-31 ENCOUNTER — Ambulatory Visit
Admission: RE | Admit: 2014-03-31 | Discharge: 2014-03-31 | Disposition: A | Discharge status: Home / Self Care | Payer: Medicare Other | Source: Ambulatory Visit

## 2014-03-31 DIAGNOSIS — Z1231 Encounter for screening mammogram for malignant neoplasm of breast: Secondary | ICD-10-CM

## 2015-02-07 ENCOUNTER — Ambulatory Visit (INDEPENDENT_AMBULATORY_CARE_PROVIDER_SITE_OTHER): Payer: Medicare Other | Admitting: Allergy and Immunology

## 2015-02-07 ENCOUNTER — Encounter: Payer: Self-pay | Admitting: Allergy and Immunology

## 2015-02-07 ENCOUNTER — Other Ambulatory Visit: Payer: Self-pay | Admitting: *Deleted

## 2015-02-07 VITALS — BP 112/72 | HR 76 | Resp 20

## 2015-02-07 DIAGNOSIS — J4540 Moderate persistent asthma, uncomplicated: Secondary | ICD-10-CM

## 2015-02-07 DIAGNOSIS — J387 Other diseases of larynx: Secondary | ICD-10-CM

## 2015-02-07 DIAGNOSIS — J3089 Other allergic rhinitis: Secondary | ICD-10-CM | POA: Diagnosis not present

## 2015-02-07 DIAGNOSIS — K219 Gastro-esophageal reflux disease without esophagitis: Secondary | ICD-10-CM

## 2015-02-07 MED ORDER — OMEPRAZOLE 20 MG PO CPDR: 20.0000 mg | DELAYED_RELEASE_CAPSULE | Freq: Every day | ORAL | Status: DC

## 2015-02-07 NOTE — Progress Notes (Signed)
Rebecca Hubbard  Follow-up Note  Refering Provider: Margaretmary Eddy, MD PCP: Margaretmary Eddy, MD  Subjective:   Rebecca Hubbard is a 74 y.o. female who returns to the [facility] in re-evaluation of the following:  HPI Comments:  Chandel has done very well since her last visit. She is resolved all the issues with her nose and her throat and her cough. She's noticed a tremendous decrease in her throat clearing. She's had no need to use a short-acting bronchodilator. She is down to drinking 1 cup of coffee per day and has eliminated alcohol and chocolate. She continues to consistently use her to [place] and over-the-counter Rhinocort on occasion. She also continues on therapy for reflux including omeprazole and ranitidine. She did receive the flu vaccine.   Outpatient Prescriptions Prior to Visit  Medication Sig  . acetaminophen (TYLENOL) 325 MG tablet Take 2 tablets (650 mg total) by mouth every 6 (six) hours as needed for mild pain (or Fever >/= 101).  Marland Kitchen albuterol (PROAIR HFA) 108 (90 BASE) MCG/ACT inhaler Inhale 2 puffs into the lungs every 6 (six) hours as needed for wheezing or shortness of breath.   . allopurinol (ZYLOPRIM) 100 MG tablet Take 1 tablet by mouth as needed.  . Bepotastine Besilate (BEPREVE) 1.5 % SOLN Place 1 drop into both eyes every morning. 1.5 %  . calcium carbonate (OS-CAL) 600 MG TABS tablet Take 600 mg by mouth daily.  Marland Kitchen GELNIQUE 10 % GEL Apply 1 application topically daily as needed (going to the bathroom as often).   Marland Kitchen latanoprost (XALATAN) 0.005 % ophthalmic solution Place 1 drop into both eyes at bedtime.   Marland Kitchen LEVOXYL 125 MCG tablet Take 125 mcg by mouth daily before breakfast.   . mometasone-formoterol (DULERA) 100-5 MCG/ACT AERO Inhale 2 puffs into the lungs 2 (two) times daily.  . ondansetron (ZOFRAN) 4 MG tablet Take 1 tablet (4 mg total) by mouth every 6 (six) hours as needed for  nausea.  Marland Kitchen triamterene-hydrochlorothiazide (DYAZIDE) 37.5-25 MG per capsule Take 1 capsule by mouth every morning.   Marland Kitchen ZETIA 10 MG tablet Take 10 mg by mouth every morning.    No facility-administered medications prior to visit.    No orders of the defined types were placed in this encounter.    Past Medical History  Diagnosis Date  . Hypertension   . Glaucoma   . Hypothyroidism   . Seasonal allergies   . Asthma     Past Surgical History  Procedure Laterality Date  . Thyroidectomy    . Abdominal hysterectomy      Allergies  Allergen Reactions  . Penicillins Hives  . Sulfa Antibiotics Other (See Comments), Anaphylaxis and Swelling    Other Reaction: Other reaction  . Aspirin Rash  . Atorvastatin Other (See Comments)  . Meloxicam     Other reaction(s): Other (See Comments) Constipation    Review of Systems  Constitutional: Negative for fever, chills, weight loss and malaise/fatigue.  HENT: Negative for congestion, ear discharge, ear pain, nosebleeds and sore throat.   Eyes: Negative for blurred vision, pain, discharge and redness.  Respiratory: Negative for cough, hemoptysis, sputum production, shortness of breath, wheezing and stridor.   Cardiovascular: Negative for chest pain, palpitations and leg swelling.  Gastrointestinal: Negative for heartburn, nausea, vomiting, abdominal pain and diarrhea.  Skin: Negative for itching and rash.  Neurological: Negative for dizziness and headaches.     Objective:   Filed  Vitals:   02/07/15 1139  BP: 112/72  Pulse: 76  Resp: 20          Physical Exam  Constitutional: She is well-developed, well-nourished, and in no distress. No distress.  HENT:  Head: Normocephalic and atraumatic. Head is without right periorbital erythema and without left periorbital erythema.  Right Ear: Tympanic membrane, external ear and ear canal normal. No drainage. No foreign bodies. Tympanic membrane is not injected, not scarred, not  perforated, not erythematous, not retracted and not bulging. No middle ear effusion.  Left Ear: Tympanic membrane, external ear and ear canal normal. No drainage. No foreign bodies. Tympanic membrane is not injected, not scarred, not perforated, not erythematous, not retracted and not bulging.  No middle ear effusion.  Nose: Nose normal. No mucosal edema, rhinorrhea, nose lacerations, sinus tenderness, nasal deformity, septal deviation or nasal septal hematoma. No epistaxis.  Mouth/Throat: Oropharynx is clear and moist and mucous membranes are normal. No oropharyngeal exudate, posterior oropharyngeal edema, posterior oropharyngeal erythema or tonsillar abscesses.  Eyes: Conjunctivae and lids are normal. Pupils are equal, round, and reactive to light. Right eye exhibits no discharge and no exudate. No foreign body present in the right eye. Left eye exhibits no discharge and no exudate. No foreign body present in the left eye. Right conjunctiva is not injected. Right conjunctiva has no hemorrhage. Left conjunctiva is not injected. Left conjunctiva has no hemorrhage. No scleral icterus.  Neck: No tracheal tenderness present. No tracheal deviation present. No thyromegaly present.  Cardiovascular: Normal rate, regular rhythm, S1 normal, S2 normal and normal heart sounds.  Exam reveals no gallop and no friction rub.   No murmur heard. Pulmonary/Chest: Effort normal. No respiratory distress. She has no wheezes. She has no rhonchi. She has no rales. She exhibits no tenderness.  Musculoskeletal: She exhibits no edema or tenderness.  Lymphadenopathy:    She has no cervical adenopathy.  Skin: No purpura and no rash noted. Rash is not macular, not maculopapular, not nodular, not pustular, not vesicular and not urticarial. She is not diaphoretic. No cyanosis or erythema. No pallor. Nails show no clubbing.  Psychiatric: Mood, affect and judgment normal.    Diagnostics:    Spirometry was performed and  demonstrated an FEV1 of 1.68 at 116 % of predicted.  The patient had an Asthma Control Test with the following results: ACT Total Score: 19.    Assessment and Plan:   1. Moderate persistent asthma, uncomplicated   2. Other allergic rhinitis   3. LPRD (laryngopharyngeal reflux disease)      1. Continue omeprazole 20 in AM plus ranitidine 300 in PM   2. Continue Dulera 200 2 puffs two times per day and OTC Rhinocort one spray each nostril one time per day  3. Continue allerga, ProAir HFA, Pazeo if needed.  4. Return in 8 weeks  Overall [place] has done wonderful. Her respiratory tract inflammation and irritation is coming under excellent control with a combination of therapy directed against both her atopic disease and reflux. We'll keep her on this plan for an additional 8 weeks which will calculate out to a full 12 weeks of therapy at which time we will see if we can consolidate her treatment.    Allena Katz, MD [place]

## 2015-02-07 NOTE — Patient Instructions (Signed)
  1. Continue omeprazole 20 plus ranitidine 300 daily  2. Continue Dulera 200 2 puffs two times per day and OTC Rhinocort  3. Continue allerga, ProAir HFA, Pazeo if needed.  4. Return in 8 weeks

## 2015-02-28 ENCOUNTER — Other Ambulatory Visit: Payer: Self-pay

## 2015-02-28 MED ORDER — RANITIDINE HCL 300 MG PO TABS: ORAL_TABLET | ORAL | Status: DC

## 2015-04-18 ENCOUNTER — Encounter: Payer: Self-pay | Admitting: Allergy and Immunology

## 2015-04-18 ENCOUNTER — Ambulatory Visit (INDEPENDENT_AMBULATORY_CARE_PROVIDER_SITE_OTHER): Payer: Medicare Other | Admitting: Allergy and Immunology

## 2015-04-18 VITALS — BP 132/72 | HR 76 | Resp 20

## 2015-04-18 DIAGNOSIS — J4540 Moderate persistent asthma, uncomplicated: Secondary | ICD-10-CM | POA: Diagnosis not present

## 2015-04-18 DIAGNOSIS — K219 Gastro-esophageal reflux disease without esophagitis: Secondary | ICD-10-CM

## 2015-04-18 DIAGNOSIS — J387 Other diseases of larynx: Secondary | ICD-10-CM | POA: Diagnosis not present

## 2015-04-18 DIAGNOSIS — J3089 Other allergic rhinitis: Secondary | ICD-10-CM | POA: Diagnosis not present

## 2015-04-18 MED ORDER — DULERA 200-5 MCG/ACT IN AERO: INHALATION_SPRAY | RESPIRATORY_TRACT | Status: DC

## 2015-04-18 MED ORDER — OMEPRAZOLE 20 MG PO CPDR: 20.0000 mg | DELAYED_RELEASE_CAPSULE | Freq: Every day | ORAL | Status: DC

## 2015-04-18 NOTE — Progress Notes (Signed)
[facility]  Follow-up Note  Referring Provider: Margaretmary Eddy, MD Primary Provider: Margaretmary Eddy, MD Date of Office Visit: @DATE @  Subjective:   Rebecca Hubbard is a 75 y.o. female who returns to the [facility] in re-evaluation of the following:  HPI Comments:  Rebecca Hubbard returns to this clinic on 04/18/2015 in reevaluation of her asthma and allergic rhinitis and LPR. She has done great with no respiratory tract symptoms at all while utilizing her plan which includes Dulera, Rhinocort, and a combination of omeprazole and ranitidine. She is completely eliminated her sneezing and itchy eyes and she has no gagging or spitting her throat clearing or lumps stuck in her throat or coughing. She has no need to use any short acting bronchodilator.   Current Outpatient Prescriptions on File Prior to Visit  Medication Sig Dispense Refill  . albuterol (PROAIR HFA) 108 (90 BASE) MCG/ACT inhaler Inhale 2 puffs into the lungs every 6 (six) hours as needed for wheezing or shortness of breath.     . Bepotastine Besilate (BEPREVE) 1.5 % SOLN Place 1 drop into both eyes every morning. 1.5 %    . calcium carbonate (OS-CAL) 600 MG TABS tablet Take 600 mg by mouth daily.    Marland Kitchen ezetimibe (ZETIA) 10 MG tablet TAKE 1 TABLET (10 MG) ONCE DAILY    . GELNIQUE 10 % GEL Apply 1 application topically daily as needed (going to the bathroom as often).     Marland Kitchen latanoprost (XALATAN) 0.005 % ophthalmic solution Place 1 drop into both eyes at bedtime.     Marland Kitchen levothyroxine (SYNTHROID, LEVOTHROID) 112 MCG tablet Take by mouth.    . ranitidine (ZANTAC) 300 MG tablet Take one tablet once every evening as directed for reflux. 30 tablet 2  . triamterene-hydrochlorothiazide (DYAZIDE) 37.5-25 MG capsule Take 1 capsule by mouth.    Marland Kitchen acetaminophen (TYLENOL) 325 MG tablet Take 2 tablets (650 mg total) by mouth every 6 (six) hours as needed for mild pain  (or Fever >/= 101). (Patient not taking: Reported on 04/18/2015) 30 tablet 0  . allopurinol (ZYLOPRIM) 100 MG tablet Take 1 tablet by mouth as needed. Reported on 04/18/2015    . ondansetron (ZOFRAN) 4 MG tablet Take 1 tablet (4 mg total) by mouth every 6 (six) hours as needed for nausea. (Patient not taking: Reported on 04/18/2015) 20 tablet 0  . triamterene-hydrochlorothiazide (DYAZIDE) 37.5-25 MG per capsule Take 1 capsule by mouth every morning.      No current facility-administered medications on file prior to visit.    Meds ordered this encounter  Medications  . DULERA 200-5 MCG/ACT AERO    Sig: USE 2 PUFFS WITH SPACER EVERY 12 HOURS TO PREVENT COUGH OR WHEEZE, RINSE, GARGLE AND SPIT AFTER USE    Dispense:  1 Inhaler    Refill:  5  . omeprazole (PRILOSEC) 20 MG capsule    Sig: Take 1 capsule (20 mg total) by mouth daily.    Dispense:  30 capsule    Refill:  5    Past Medical History  Diagnosis Date  . Hypertension   . Glaucoma   . Hypothyroidism   . Seasonal allergies   . Asthma     Past Surgical History  Procedure Laterality Date  . Thyroidectomy    . Abdominal hysterectomy      Allergies  Allergen Reactions  . Penicillins Hives  . Sulfa Antibiotics Other (See Comments), Anaphylaxis and Swelling  Other Reaction: Other reaction  . Aspirin Rash  . Atorvastatin Other (See Comments)  . Meloxicam     Other reaction(s): Other (See Comments) Constipation    Review of systems negative except as noted in HPI / PMHx or noted below:  Review of Systems  Constitutional: Negative.   HENT: Negative.   Eyes: Negative.   Respiratory: Negative.   Cardiovascular: Negative.   Gastrointestinal: Negative.   Genitourinary: Negative.   Musculoskeletal: Negative.   Skin: Negative.   Neurological: Negative.   Endo/Heme/Allergies: Negative.   Psychiatric/Behavioral: Negative.      Objective:   Filed Vitals:   04/18/15 1109  BP: 132/72  Pulse: 76  Resp: 20           Physical Exam  Constitutional: She is well-developed, well-nourished, and in no distress. No distress.  HENT:  Head: Normocephalic.  Right Ear: Tympanic membrane, external ear and ear canal normal.  Left Ear: Tympanic membrane, external ear and ear canal normal.  Nose: Nose normal. No mucosal edema or rhinorrhea.  Mouth/Throat: Uvula is midline, oropharynx is clear and moist and mucous membranes are normal. No oropharyngeal exudate.  Neck: Trachea normal. No tracheal tenderness present. No tracheal deviation present. No thyromegaly present.  Cardiovascular: Normal rate, regular rhythm, S1 normal, S2 normal and normal heart sounds.   No murmur heard. Pulmonary/Chest: Breath sounds normal. No stridor. No respiratory distress. She has no wheezes. She has no rales.  Musculoskeletal: She exhibits no edema.  Lymphadenopathy:       Head (right side): No tonsillar adenopathy present.       Head (left side): No tonsillar adenopathy present.    She has no cervical adenopathy.    She has no axillary adenopathy.  Neurological: She is alert. Gait normal.  Skin: No rash noted. She is not diaphoretic. No erythema. Nails show no clubbing.  Psychiatric: Mood and affect normal.    Diagnostics:    Spirometry was performed and demonstrated an FEV1 of 1.57 at 108 % of predicted.  The patient had an Asthma Control Test with the following results:  .    Assessment and Plan:   1. Moderate persistent asthma, uncomplicated   2. Other allergic rhinitis   3. LPRD (laryngopharyngeal reflux disease)      1. Continue omeprazole 20: Try to stop ranitidine  2. Continue Dulera 200 2 puffs two times per day: Can try to decrease to one time per day  3. Continue OTC Rhinocort  3. Continue allerga, ProAir HFA, Pazeo if needed.  4. Return in 6 months or earlier if problem  Rebecca Hubbard has done very well and we will now see we can consolidate her medical therapy. We'll have her decrease her therapy for  reflux to just omeprazole and discontinuing her ranitidine and we'll have her consolidate her ICS use to [facility] one time per day. She appears to have a very good understanding of these medications and how they work and she understands that she can go back to using a combination therapy for her reflux and a combination therapy twice a day for her asthma if she needs to. At this point we'll assume she will do great and see her back in this clinic in 6 months but certainly if there is difficulty as we move through these next 6 months she can return to this clinic sooner. She is very atopic and her plan and may fall apart during this upcoming spring and we may have to deal with that loss of  control should it develop.    Allena Katz, MD [place]

## 2015-04-18 NOTE — Patient Instructions (Signed)
  1. Continue omeprazole 20: Try to stop ranitidine  2. Continue Dulera 200 2 puffs two times per day: Can try to decrease to one time per day  3. Continue OTC Rhinocort  3. Continue allerga, ProAir HFA, Pazeo if needed.  4. Return in 6 months

## 2015-05-29 ENCOUNTER — Other Ambulatory Visit: Payer: Self-pay

## 2015-05-29 DIAGNOSIS — Z1231 Encounter for screening mammogram for malignant neoplasm of breast: Secondary | ICD-10-CM

## 2015-06-16 ENCOUNTER — Ambulatory Visit
Admission: RE | Admit: 2015-06-16 | Discharge: 2015-06-16 | Disposition: A | Discharge status: Home / Self Care | Payer: Medicare Other | Source: Ambulatory Visit

## 2015-06-16 DIAGNOSIS — Z1231 Encounter for screening mammogram for malignant neoplasm of breast: Secondary | ICD-10-CM

## 2016-07-10 ENCOUNTER — Other Ambulatory Visit: Payer: Self-pay | Admitting: Allergy and Immunology

## 2016-11-11 ENCOUNTER — Telehealth: Payer: Self-pay | Admitting: *Deleted

## 2016-11-11 NOTE — Telephone Encounter (Signed)
Left message that the bill was send in error & to disregard it - told her to call me back with any questions - kt

## 2016-11-11 NOTE — Telephone Encounter (Signed)
Patient just received a bill from her old [employer] account and is wanting to know why she just received a bill from January of 2017. She has a few other questions and would like a return call from someone in billing asap.

## 2017-02-25 DIAGNOSIS — Z6841 Body Mass Index (BMI) 40.0 and over, adult: Secondary | ICD-10-CM | POA: Insufficient documentation

## 2017-03-08 DIAGNOSIS — E782 Mixed hyperlipidemia: Secondary | ICD-10-CM | POA: Insufficient documentation

## 2017-07-08 DIAGNOSIS — M1A9XX1 Chronic gout, unspecified, with tophus (tophi): Secondary | ICD-10-CM | POA: Insufficient documentation

## 2017-07-08 DIAGNOSIS — N3941 Urge incontinence: Secondary | ICD-10-CM | POA: Insufficient documentation

## 2017-09-10 DIAGNOSIS — I350 Nonrheumatic aortic (valve) stenosis: Secondary | ICD-10-CM | POA: Insufficient documentation

## 2018-05-25 ENCOUNTER — Ambulatory Visit (INDEPENDENT_AMBULATORY_CARE_PROVIDER_SITE_OTHER): Payer: Medicare Other | Admitting: Family Medicine

## 2018-05-25 ENCOUNTER — Other Ambulatory Visit: Payer: Self-pay

## 2018-05-25 ENCOUNTER — Encounter: Payer: Self-pay | Admitting: Family Medicine

## 2018-05-25 VITALS — BP 142/76 | HR 67 | Temp 99.1°F | Resp 16 | Ht 59.0 in | Wt 213.6 lb

## 2018-05-25 DIAGNOSIS — N3281 Overactive bladder: Secondary | ICD-10-CM

## 2018-05-25 DIAGNOSIS — F339 Major depressive disorder, recurrent, unspecified: Secondary | ICD-10-CM

## 2018-05-25 DIAGNOSIS — I10 Essential (primary) hypertension: Secondary | ICD-10-CM | POA: Diagnosis not present

## 2018-05-25 DIAGNOSIS — I350 Nonrheumatic aortic (valve) stenosis: Secondary | ICD-10-CM

## 2018-05-25 DIAGNOSIS — Z789 Other specified health status: Secondary | ICD-10-CM | POA: Insufficient documentation

## 2018-05-25 DIAGNOSIS — E782 Mixed hyperlipidemia: Secondary | ICD-10-CM

## 2018-05-25 DIAGNOSIS — H409 Unspecified glaucoma: Secondary | ICD-10-CM | POA: Insufficient documentation

## 2018-05-25 HISTORY — DX: Major depressive disorder, recurrent, unspecified: F33.9

## 2018-05-25 MED ORDER — TRIAMTERENE-HCTZ 37.5-25 MG PO TABS: 1.0000 | ORAL_TABLET | Freq: Every day | ORAL | 3 refills | Status: DC

## 2018-05-25 MED ORDER — OMEPRAZOLE 20 MG PO CPDR: 20.0000 mg | DELAYED_RELEASE_CAPSULE | Freq: Every day | ORAL | 5 refills | Status: DC | PRN

## 2018-05-25 NOTE — Progress Notes (Signed)
Subjective  CC:  Chief Complaint  Patient presents with  . Establish Care    Previous pt of Dr. Manuella Ghazi in [facility]  . Hypertension    Had been on Triamterene/HTCZ, was d/c'd about 8 month ago.  Marland Kitchen Hyperthyroidism    Taking Levothyroxine 178mcg  . Eye hemorrhage    Right eye, had injection 05/22/18    HPI: Rebecca Hubbard is a 78 y.o. female who presents to [place] at [facility] today to establish care with me as a new patient.  I have reviewed records from [facility] PCP.  She has the following concerns or needs:  78 yo with HTN, HLD, low thyroid, obesity, gout and asthma who does really well overall: very active with multiple activities outside of the home. Lives with her daughter. Originally from [place].  Recently had retinal hemorrhage: worried since she was taken off of her BP medication by her most recent PCP who was "new". No cp or h/o CAD. Has mild LE edema. Had been well controlled on maxzide for years. Reviewed labs: nl renal function. Had recent ECHO with mild LVH and trivial TR, MR with mild AS.   HLD - on zetia. Statin intolerant.   Low thyroid on meds. Has been stable.   ashtma - reports good control.   Daughter brings up h/o depression: typically starts in September and runs through holidays; withdraws, low mood and pt reports started when she divorced her husband. Doesn't really want to take medications but realized it is a problem for herself and her family.   Due for CPE.   Assessment  1. Essential hypertension   2. Glaucoma, unspecified glaucoma type, unspecified laterality   3. Statin intolerance   4. Aortic stenosis, mild   5. Mixed hyperlipidemia   6. OAB (overactive bladder)   7. Depression, recurrent ([place])      Plan   HTN: restart meds and recheck in 4-6 weeks.   Other problems are reportedly stable. rec cpe for labs and f/u soon.   Depression: counseling done. Doing well now. Started conversation about SSRI and mgt options for the  fall. Pt is open to further discussion.   AWV due in May.   Follow up:  Return in about 4 weeks (around 06/22/2018) for complete physical, follow up Hypertension. No orders of the defined types were placed in this encounter.  Meds ordered this encounter  Medications  . triamterene-hydrochlorothiazide (MAXZIDE-25) 37.5-25 MG tablet    Sig: Take 1 tablet by mouth daily.    Dispense:  90 tablet    Refill:  3  . omeprazole (PRILOSEC) 20 MG capsule    Sig: Take 1 capsule (20 mg total) by mouth daily as needed.    Dispense:  30 capsule    Refill:  5     Depression screen [facility] 2/9 05/25/2018  Decreased Interest 0  Down, Depressed, Hopeless 1  PHQ - 2 Score 1  Altered sleeping 1  Tired, decreased energy 1  Change in appetite 0  Feeling bad or failure about yourself  0  Trouble concentrating 1  Moving slowly or fidgety/restless 0  Suicidal thoughts 0  PHQ-9 Score 4  Difficult doing work/chores Not difficult at all    We updated and reviewed the patient's past history in detail and it is documented below.  Patient Active Problem List   Diagnosis Date Noted  . Depression, recurrent ([place]) 05/25/2018    Priority: High    Sept  - January: since divorcing  her husband many years ago.    . Mixed hyperlipidemia 03/08/2017    Priority: High  . BMI 45.0-49.9, adult ([place]) 02/25/2017    Priority: High  . Essential hypertension 10/12/2010    Priority: High  . Hypothyroidism due to acquired atrophy of thyroid 10/12/2010    Priority: High  . Aortic stenosis, mild 09/10/2017    Priority: Medium    Echo 08/2017:  2.6 m/s peak vel  26 mmHg peak grad 16 mmHg mean grad 1.0 cm2.  Normal systolic function.   . Chronic tophaceous gout 07/08/2017    Priority: Medium  . LPRD (laryngopharyngeal reflux disease) 04/18/2015    Priority: Medium  . Moderate persistent asthma 04/18/2015    Priority: Medium  . Colon adenoma 11/03/2013    Priority: Medium  . Osteoarthritis 10/12/2010    Priority:  Medium  . Urge incontinence of urine 07/08/2017    Priority: Low  . Other allergic rhinitis 04/18/2015    Priority: Low  . Glaucoma 05/25/2018  . Statin intolerance 05/25/2018    crestor and lipitor   . Subarachnoid hemorrhage following injury ([place]) 01/29/2014   Health Maintenance  Topic Date Due  . TETANUS/TDAP  01/27/1958  . DEXA SCAN  01/28/2004  . INFLUENZA VACCINE  Completed  . PNA vac Low Risk Adult  Completed   Immunization History  Administered Date(s) Administered  . Influenza Split 03/23/2012  . Influenza, High Dose Seasonal PF 03/15/2014, 12/29/2014, 02/05/2016, 02/25/2017, 01/22/2018  . Pneumococcal Conjugate-13 01/27/2014  . Pneumococcal Polysaccharide-23 03/23/2012, 03/26/2012   Current Meds  Medication Sig  . albuterol (PROAIR HFA) 108 (90 BASE) MCG/ACT inhaler Inhale 2 puffs into the lungs every 6 (six) hours as needed for wheezing or shortness of breath.   . Bepotastine Besilate (BEPREVE) 1.5 % SOLN Place 1 drop into both eyes every morning. 1.5 %  . calcium carbonate (OS-CAL) 600 MG TABS tablet Take 600 mg by mouth daily.  . DULERA 200-5 MCG/ACT AERO USE 2 PUFFS WITH SPACER EVERY 12 HOURS TO PREVENT COUGH OR WHEEZE, RINSE, GARGLE AND SPIT AFTER USE  . ezetimibe (ZETIA) 10 MG tablet TAKE 1 TABLET (10 MG) ONCE DAILY  . GELNIQUE 10 % GEL Apply 1 application topically daily as needed (going to the bathroom as often).   Marland Kitchen latanoprost (XALATAN) 0.005 % ophthalmic solution Place 1 drop into both eyes at bedtime.   Marland Kitchen levothyroxine (SYNTHROID, LEVOTHROID) 112 MCG tablet Take by mouth.  . meloxicam (MOBIC) 15 MG tablet Take 1 tablet by mouth daily as needed.  Marland Kitchen omeprazole (PRILOSEC) 20 MG capsule Take 1 capsule (20 mg total) by mouth daily as needed.  . [DISCONTINUED] omeprazole (PRILOSEC) 20 MG capsule Take 1 capsule (20 mg total) by mouth daily.  . [DISCONTINUED] ranitidine (ZANTAC) 300 MG tablet Take one tablet once every evening as directed for reflux.  .  [DISCONTINUED] rosuvastatin (CRESTOR) 5 MG tablet     Allergies: Patient is allergic to penicillins; sulfa antibiotics; aspirin; and atorvastatin. Past Medical History Patient  has a past medical history of Asthma, Depression, recurrent ([place]) (05/25/2018), GERD (gastroesophageal reflux disease), Glaucoma, Hyperlipidemia, Hypertension, Hypothyroidism, and Seasonal allergies. Past Surgical History Patient  has a past surgical history that includes Thyroidectomy and Abdominal hysterectomy. Family History: Patient family history includes Alzheimer's disease in her father; Cancer in her sister; Stroke in her mother. Social History:  Patient  reports that she has never smoked. She has never used smokeless tobacco. She reports current alcohol use of about 5.0 standard drinks of  alcohol per week. She reports that she does not use drugs.  Review of Systems: Constitutional: negative for fever or malaise Ophthalmic: negative for photophobia, double vision or loss of vision Cardiovascular: negative for chest pain, dyspnea on exertion, or new LE swelling Respiratory: negative for SOB or persistent cough Gastrointestinal: negative for abdominal pain, change in bowel habits or melena Genitourinary: negative for dysuria or gross hematuria Musculoskeletal: negative for new gait disturbance or muscular weakness Integumentary: negative for new or persistent rashes Neurological: negative for TIA or stroke symptoms Psychiatric: negative for SI or delusions Allergic/Immunologic: negative for hives  Patient Care Team    Relationship Specialty Notifications Start End  Leamon Arnt, MD PCP - General Family Medicine  05/25/18     Objective  Vitals: BP (!) 142/76   Pulse 67   Temp 99.1 F (37.3 C) (Oral)   Resp 16   Ht 4\' 11"  (1.499 m)   Wt 213 lb 9.6 oz (96.9 kg)   SpO2 97%   BMI 43.14 kg/m  General:  Well developed, well nourished, no acute distress  Psych:  Alert and oriented,normal mood and  affect HEENT:  Normocephalic, atraumatic, non-icteric sclera, PERRL, oropharynx is without mass or exudate, supple neck without adenopathy, mass or thyromegaly Cardiovascular:  RRR without gallop, rub or murmur, nondisplaced PMI Respiratory:  Good breath sounds bilaterally, CTAB with normal respiratory effort MSK: no deformities, contusions. Joints are without erythema or swelling Skin:  Warm, no rashes or suspicious lesions noted Neurologic:    Mental status is normal. Gross motor and sensory exams are normal. Normal gait   Commons side effects, risks, benefits, and alternatives for medications and treatment plan prescribed today were discussed, and the patient expressed understanding of the given instructions. Patient is instructed to call or message via MyChart if he/she has any questions or concerns regarding our treatment plan. No barriers to understanding were identified. We discussed Red Flag symptoms and signs in detail. Patient expressed understanding regarding what to do in case of urgent or emergency type symptoms.   Medication list was reconciled, printed and provided to the patient in AVS. Patient instructions and summary information was reviewed with the patient as documented in the AVS. This note was prepared with assistance of Dragon voice recognition software. Occasional wrong-word or sound-a-like substitutions may have occurred due to the inherent limitations of voice recognition software

## 2018-05-25 NOTE — Patient Instructions (Addendum)
Please return in 4-6 weeks for your annual complete physical; please come fasting.  I have restarted your maxzide for your blood pressure.   It was a pleasure meeting you today! Thank you for choosing [place] to meet your healthcare needs! I truly look forward to working with you. If you have any questions or concerns, please send me a message via Mychart or call the office at [phone].

## 2018-07-28 ENCOUNTER — Encounter: Payer: Medicare Other | Admitting: Family Medicine

## 2018-09-11 ENCOUNTER — Encounter: Payer: Medicare Other | Admitting: Family Medicine

## 2018-09-23 ENCOUNTER — Other Ambulatory Visit: Payer: Self-pay

## 2018-09-23 ENCOUNTER — Encounter: Payer: Self-pay | Admitting: Family Medicine

## 2018-09-23 ENCOUNTER — Ambulatory Visit (INDEPENDENT_AMBULATORY_CARE_PROVIDER_SITE_OTHER): Payer: Medicare Other | Admitting: Family Medicine

## 2018-09-23 VITALS — BP 115/60 | HR 60 | Temp 98.3°F | Resp 16 | Ht 59.0 in | Wt 214.4 lb

## 2018-09-23 DIAGNOSIS — I10 Essential (primary) hypertension: Secondary | ICD-10-CM | POA: Diagnosis not present

## 2018-09-23 DIAGNOSIS — F339 Major depressive disorder, recurrent, unspecified: Secondary | ICD-10-CM | POA: Diagnosis not present

## 2018-09-23 DIAGNOSIS — E782 Mixed hyperlipidemia: Secondary | ICD-10-CM | POA: Diagnosis not present

## 2018-09-23 DIAGNOSIS — E034 Atrophy of thyroid (acquired): Secondary | ICD-10-CM | POA: Diagnosis not present

## 2018-09-23 DIAGNOSIS — M1A9XX1 Chronic gout, unspecified, with tophus (tophi): Secondary | ICD-10-CM | POA: Diagnosis not present

## 2018-09-23 LAB — COMPREHENSIVE METABOLIC PANEL
ALT: 14 U/L (ref 0–35)
AST: 13 U/L (ref 0–37)
Albumin: 3.6 g/dL (ref 3.5–5.2)
Alkaline Phosphatase: 91 U/L (ref 39–117)
BUN: 15 mg/dL (ref 6–23)
CO2: 27 mEq/L (ref 19–32)
Calcium: 8.4 mg/dL (ref 8.4–10.5)
Chloride: 99 mEq/L (ref 96–112)
Creatinine, Ser: 0.76 mg/dL (ref 0.40–1.20)
GFR: 88.68 mL/min (ref >60.00)
Glucose, Bld: 73 mg/dL (ref 70–99)
Potassium: 4 mEq/L (ref 3.5–5.1)
Sodium: 135 mEq/L (ref 135–145)
Total Bilirubin: 0.7 mg/dL (ref 0.2–1.2)
Total Protein: 6.1 g/dL (ref 6.0–8.3)

## 2018-09-23 LAB — CBC WITH DIFFERENTIAL/PLATELET
Basophils Absolute: 0 10*3/uL (ref 0.0–0.1)
Basophils Relative: 0.6 % (ref 0.0–3.0)
Eosinophils Absolute: 0.2 10*3/uL (ref 0.0–0.7)
Eosinophils Relative: 2.8 % (ref 0.0–5.0)
HCT: 37.7 % (ref 36.0–46.0)
Hemoglobin: 12.8 g/dL (ref 12.0–15.0)
Lymphocytes Relative: 29.7 % (ref 12.0–46.0)
Lymphs Abs: 1.7 10*3/uL (ref 0.7–4.0)
MCHC: 33.9 g/dL (ref 30.0–36.0)
MCV: 90.7 fl (ref 78.0–100.0)
Monocytes Absolute: 0.5 10*3/uL (ref 0.1–1.0)
Monocytes Relative: 9.5 % (ref 3.0–12.0)
Neutro Abs: 3.2 10*3/uL (ref 1.4–7.7)
Neutrophils Relative %: 57.4 % (ref 43.0–77.0)
Platelets: 204 10*3/uL (ref 150.0–400.0)
RBC: 4.16 Mil/uL (ref 3.87–5.11)
RDW: 14.5 % (ref 11.5–15.5)
WBC: 5.6 10*3/uL (ref 4.0–10.5)

## 2018-09-23 LAB — LIPID PANEL
Cholesterol: 163 mg/dL (ref 0–200)
HDL: 53.6 mg/dL (ref >39.00)
LDL Cholesterol: 86 mg/dL (ref 0–99)
NonHDL: 109.61
Total CHOL/HDL Ratio: 3
Triglycerides: 120 mg/dL (ref 0.0–149.0)
VLDL: 24 mg/dL (ref 0.0–40.0)

## 2018-09-23 LAB — URIC ACID: Uric Acid, Serum: 6.2 mg/dL (ref 2.4–7.0)

## 2018-09-23 LAB — TSH: TSH: 0.57 u[IU]/mL (ref 0.35–4.50)

## 2018-09-23 MED ORDER — ZOSTER VAC RECOMB ADJUVANTED 50 MCG/0.5ML IM SUSR: 0.5000 mL | Freq: Once | INTRAMUSCULAR | 0 refills | Status: AC

## 2018-09-23 NOTE — Patient Instructions (Addendum)
Please return in 6 months for follow up of your hypertension.  Please have your daughter call us to clarify your blood pressure medications. She can use this summary for comparison.   If you have any questions or concerns, please don't hesitate to send me a message via MyChart or call the office at [phone]. Thank you for visiting with [place] today! It's our pleasure caring for you.  I'm glad you are dong well. Call me if you need me.

## 2018-09-23 NOTE — Progress Notes (Signed)
Subjective  Chief Complaint  Patient presents with  . Annual Exam  . Hypertension    HPI: Rebecca Hubbard is a 78 y.o. female who presents to [facility] [place] today for a Female Wellness Visit. She also has the concerns and/or needs as listed above in the chief complaint. These will be addressed in addition to the Health Maintenance Visit.   Wellness Visit: annual visit with health maintenance review and exam without Pap   Nonfasting; doing great except can't stand staying home. Feels well. Mild knee pail alleviated with tylenol. Pt refuses bone density scan.  Chronic disease f/u and/or acute problem visit: (deemed necessary to be done in addition to the wellness visit):  HTN and HLD: have been well controlled. Unclear which meds she is on.... she can't remember. Will need to look at her bottles. No edema, cp, or sob. No palpitations  Mild AS by echo 2019: no lightheadedness.   Depression: no problems with mood now. Tends to be in winter.   Hypothyroidism: feels great. Takes meds. No sxs of high or low thryoid   H/o gout w/o any recent flares. No preventives.   OA: knees, mild. Swimming helps.   Assessment  1. Mixed hyperlipidemia   2. Essential hypertension   3. Depression, recurrent ([name])   4. Hypothyroidism due to acquired atrophy of thyroid   5. Chronic tophaceous gout      Plan  Female Wellness Visit:  Age appropriate Health Maintenance and Prevention measures were discussed with patient. Included topics are cancer screening recommendations, ways to keep healthy (see AVS) including dietary and exercise recommendations, regular eye and dental care, use of seat belts, and avoidance of moderate alcohol use and tobacco use.   BMI: discussed patient's BMI and encouraged positive lifestyle modifications to help get to or maintain a target BMI.  HM needs and immunizations were addressed and ordered. See below for orders. See HM and immunization  section for updates.  Routine labs and screening tests ordered including cmp, cbc and lipids where appropriate.  Discussed recommendations regarding Vit D and calcium supplementation (see AVS)  Chronic disease management visit and/or acute problem visit:  HTN is controlled but will clarify meds. Check labs.   AS: asymptomatic recheck echo next year  Hypothyroidism: recheck today and adjust meds as needed. Clinically she is euthyroid.   Check uric acid.   Continue OAB meds and tylenol for oa.  shingrix RX given.   Follow up: No follow-ups on file.  Orders Placed This Encounter  Procedures  . Comprehensive metabolic panel  . CBC with Differential/Platelet  . Lipid panel  . TSH  . Uric acid   Meds ordered this encounter  Medications  . Zoster Vaccine Adjuvanted [facility]) injection    Sig: Inject 0.5 mLs into the muscle once for 1 dose. Please give 2nd dose 2-6 months after first dose    Dispense:  2 each    Refill:  0      Lifestyle: Body mass index is 43.3 kg/m. Wt Readings from Last 3 Encounters:  09/23/18 214 lb 6.4 oz (97.3 kg)  05/25/18 213 lb 9.6 oz (96.9 kg)  02/01/14 227 lb 9.6 oz (103.2 kg)    Patient Active Problem List   Diagnosis Date Noted  . Depression, recurrent ([place]) 05/25/2018    Priority: High    Sept  - January: since divorcing her husband many years ago.    . Mixed hyperlipidemia 03/08/2017    Priority: High  .  Essential hypertension 10/12/2010    Priority: High  . Hypothyroidism due to acquired atrophy of thyroid 10/12/2010    Priority: High  . Aortic stenosis, mild 09/10/2017    Priority: Medium    Echo 08/2017:  2.6 m/s peak vel  26 mmHg peak grad 16 mmHg mean grad 1.0 cm2.  Normal systolic function.   . Chronic tophaceous gout 07/08/2017    Priority: Medium  . LPRD (laryngopharyngeal reflux disease) 04/18/2015    Priority: Medium  . Moderate persistent asthma 04/18/2015    Priority: Medium  . Colon adenoma 11/03/2013     Priority: Medium  . Osteoarthritis 10/12/2010    Priority: Medium  . Urge incontinence of urine 07/08/2017    Priority: Low  . Other allergic rhinitis 04/18/2015    Priority: Low  . Glaucoma 05/25/2018  . Statin intolerance 05/25/2018    crestor and lipitor   . Subarachnoid hemorrhage following injury ([place]) 01/29/2014   Health Maintenance  Topic Date Due  . TETANUS/TDAP  01/27/1958  . DEXA SCAN  01/28/2004  . INFLUENZA VACCINE  11/14/2018  . PNA vac Low Risk Adult  Completed   Immunization History  Administered Date(s) Administered  . Influenza Split 03/23/2012  . Influenza, High Dose Seasonal PF 03/15/2014, 12/29/2014, 02/05/2016, 02/25/2017, 01/22/2018  . Pneumococcal Conjugate-13 01/27/2014  . Pneumococcal Polysaccharide-23 03/23/2012, 03/26/2012   We updated and reviewed the patient's past history in detail and it is documented below. Allergies: Patient is allergic to penicillins; sulfa antibiotics; aspirin; and atorvastatin. Past Medical History Patient  has a past medical history of Asthma, Depression, recurrent ([place]) (05/25/2018), GERD (gastroesophageal reflux disease), Glaucoma, Hyperlipidemia, Hypertension, Hypothyroidism, and Seasonal allergies. Past Surgical History Patient  has a past surgical history that includes Thyroidectomy and Abdominal hysterectomy. Family History: Patient family history includes Alzheimer's disease in her father; Cancer in her sister; Stroke in her mother. Social History:  Patient  reports that she has never smoked. She has never used smokeless tobacco. She reports current alcohol use of about 5.0 standard drinks of alcohol per week. She reports that she does not use drugs.  Review of Systems: Constitutional: negative for fever or malaise Ophthalmic: negative for photophobia, double vision or loss of vision Cardiovascular: negative for chest pain, dyspnea on exertion, or new LE swelling Respiratory: negative for SOB or persistent cough  Gastrointestinal: negative for abdominal pain, change in bowel habits or melena Genitourinary: negative for dysuria or gross hematuria, no abnormal uterine bleeding or disharge Musculoskeletal: negative for new gait disturbance or muscular weakness Integumentary: negative for new or persistent rashes, no breast lumps Neurological: negative for TIA or stroke symptoms Psychiatric: negative for SI or delusions Allergic/Immunologic: negative for hives  Patient Care Team    Relationship Specialty Notifications Start End  Leamon Arnt, MD PCP - General Family Medicine  05/25/18   Calvert Cantor, MD Consulting Physician Ophthalmology  05/27/18    Wt Readings from Last 3 Encounters:  09/23/18 214 lb 6.4 oz (97.3 kg)  05/25/18 213 lb 9.6 oz (96.9 kg)  02/01/14 227 lb 9.6 oz (103.2 kg)    Objective  Vitals: BP 115/60   Pulse 60   Temp 98.3 F (36.8 C) (Oral)   Resp 16   Ht 4\' 11"  (1.499 m)   Wt 214 lb 6.4 oz (97.3 kg)   SpO2 97%   BMI 43.30 kg/m  General:  Well developed, well nourished, no acute distress  Psych:  Alert and orientedx3,normal mood and affect HEENT:  Normocephalic, atraumatic, non-icteric  sclera, PERRL, oropharynx is clear without mass or exudate, supple neck without adenopathy, mass or thyromegaly Cardiovascular:  Normal S1, S2, RRR without gallop, rub , + 2/6 sytolic murmur, nondisplaced PMI Respiratory:  Good breath sounds bilaterally, CTAB with normal respiratory effort Gastrointestinal: normal bowel sounds, soft, non-tender, no noted masses. No HSM MSK: no deformities, contusions. Joints are without erythema or swelling. Spine and CVA region are nontender Skin:  Warm, no rashes or suspicious lesions noted Neurologic:    Mental status is normal. CN 2-11 are normal. Gross motor and sensory exams are normal. Normal gait. No tremor    Commons side effects, risks, benefits, and alternatives for medications and treatment plan prescribed today were discussed, and the  patient expressed understanding of the given instructions. Patient is instructed to call or message via MyChart if he/she has any questions or concerns regarding our treatment plan. No barriers to understanding were identified. We discussed Red Flag symptoms and signs in detail. Patient expressed understanding regarding what to do in case of urgent or emergency type symptoms.   Medication list was reconciled, printed and provided to the patient in AVS. Patient instructions and summary information was reviewed with the patient as documented in the AVS. This note was prepared with assistance of Dragon voice recognition software. Occasional wrong-word or sound-a-like substitutions may have occurred due to the inherent limitations of voice recognition software

## 2018-09-23 NOTE — Addendum Note (Signed)
Addended by: Sandford Craze on: 09/23/2018 03:29 PM   Modules accepted: Orders

## 2018-09-24 ENCOUNTER — Encounter: Payer: Self-pay | Admitting: *Deleted

## 2018-09-24 NOTE — Progress Notes (Signed)
Please call patient: I have reviewed his/her lab results. All labs look great. No medication changes are needed. Let me know if she needs any refills. Thanks.

## 2018-10-21 ENCOUNTER — Other Ambulatory Visit: Payer: Self-pay | Admitting: Family Medicine

## 2018-11-20 ENCOUNTER — Other Ambulatory Visit: Payer: Self-pay | Admitting: Family Medicine

## 2018-11-20 DIAGNOSIS — E039 Hypothyroidism, unspecified: Secondary | ICD-10-CM

## 2018-12-23 ENCOUNTER — Other Ambulatory Visit: Payer: Self-pay | Admitting: Family Medicine

## 2019-01-04 ENCOUNTER — Other Ambulatory Visit: Payer: Self-pay | Admitting: Family Medicine

## 2019-01-04 DIAGNOSIS — E039 Hypothyroidism, unspecified: Secondary | ICD-10-CM

## 2019-02-22 ENCOUNTER — Other Ambulatory Visit: Payer: Self-pay | Admitting: Family Medicine

## 2019-03-03 ENCOUNTER — Other Ambulatory Visit: Payer: Self-pay

## 2019-03-03 ENCOUNTER — Encounter: Payer: Self-pay | Admitting: Family Medicine

## 2019-03-03 ENCOUNTER — Ambulatory Visit (INDEPENDENT_AMBULATORY_CARE_PROVIDER_SITE_OTHER): Payer: Medicare Other | Admitting: Family Medicine

## 2019-03-03 VITALS — BP 118/64 | HR 69 | Temp 97.2°F | Ht 59.0 in | Wt 211.0 lb

## 2019-03-03 DIAGNOSIS — I350 Nonrheumatic aortic (valve) stenosis: Secondary | ICD-10-CM | POA: Diagnosis not present

## 2019-03-03 DIAGNOSIS — F339 Major depressive disorder, recurrent, unspecified: Secondary | ICD-10-CM | POA: Diagnosis not present

## 2019-03-03 DIAGNOSIS — I10 Essential (primary) hypertension: Secondary | ICD-10-CM

## 2019-03-03 DIAGNOSIS — R2681 Unsteadiness on feet: Secondary | ICD-10-CM | POA: Diagnosis not present

## 2019-03-03 DIAGNOSIS — M159 Polyosteoarthritis, unspecified: Secondary | ICD-10-CM

## 2019-03-03 DIAGNOSIS — E034 Atrophy of thyroid (acquired): Secondary | ICD-10-CM | POA: Diagnosis not present

## 2019-03-03 DIAGNOSIS — M8949 Other hypertrophic osteoarthropathy, multiple sites: Secondary | ICD-10-CM

## 2019-03-03 MED ORDER — SYNTHROID 112 MCG PO TABS: 112.0000 ug | ORAL_TABLET | Freq: Every day | ORAL | 3 refills | Status: DC

## 2019-03-03 MED ORDER — MIRTAZAPINE 15 MG PO TABS: 15.0000 mg | ORAL_TABLET | Freq: Every day | ORAL | 5 refills | Status: DC

## 2019-03-03 NOTE — Progress Notes (Signed)
Subjective  CC:  Chief Complaint  Patient presents with  . Depression  . Dizziness    HPI: Rebecca Hubbard is a 78 y.o. female who presents to the office today to address the problems listed above in the chief complaint.  Low mood: pt is down. Has h/o depression since widowed and worse in winter months. Was doing well over the summer. Had been very active, but now with covid restrictions she is isolated and unable to travel, exercise or socialize. Thus, motivation is down and worry is high. Having trouble staying asleep.  Depression screen [facility] 2/9 03/03/2019 09/23/2018 05/25/2018  Decreased Interest 2 0 0  Down, Depressed, Hopeless 2 0 1  PHQ - 2 Score 4 0 1  Altered sleeping 3 - 1  Tired, decreased energy 2 - 1  Change in appetite 1 - 0  Feeling bad or failure about yourself  1 - 0  Trouble concentrating 0 - 1  Moving slowly or fidgety/restless 2 - 0  Suicidal thoughts 0 - 0  PHQ-9 Score 13 - 4  Difficult doing work/chores Somewhat difficult - Not difficult at all    Has knee OA s/p ortho injections but now stiff in am and gait is unsteady at times. No vertigo. No lightheadedness, palpitations or falls. Denies paresthesias in the feet. Happens mostly if walking on uneven pavement or grass outside. No syncope. No LE weakness. No longer with painful knees.   HTN: continues to be well controlled on meds.   Low thyroid: last checked in June and was stable. Energy is low but feels more related to mood.  AS: no syncope. Will be due for echo - defer till after covid surge resolves.   Assessment  1. Depression, recurrent ([place])   2. Primary osteoarthritis involving multiple joints   3. Unsteady gait   4. Aortic stenosis, mild   5. Essential hypertension   6. Hypothyroidism due to acquired atrophy of thyroid      Plan   Depression :  Start remeron for mood and sleep. Educated on risks/benefits and expectations. Close f/u. Will set up for AWV to check memory and cognitive  function as well. Seems like memory issues are more related to mood, anhedonia and worry.   Low thyroid, recheck given sxs of feeling off  Dysequilbrium: multifactorial and in part due to OA knees and likely left hip. Reassured. rec using a cane for assistance.   HTN has been controlled.   AS: doubt sxs are related to AS but need repeat echo next year. Follow up: 3 weeks for recheck 03/25/2019 for AWV  Orders Placed This Encounter  Procedures  . TSH  . CBC w/Diff   Meds ordered this encounter  Medications  . mirtazapine (REMERON) 15 MG tablet    Sig: Take 1 tablet (15 mg total) by mouth at bedtime.    Dispense:  30 tablet    Refill:  5  . SYNTHROID 112 MCG tablet    Sig: Take 1 tablet (112 mcg total) by mouth daily.    Dispense:  90 tablet    Refill:  3    DX Code Needed  .      I reviewed the patients updated PMH, FH, and SocHx.    Patient Active Problem List   Diagnosis Date Noted  . Depression, recurrent ([place]) 05/25/2018    Priority: High  . Mixed hyperlipidemia 03/08/2017    Priority: High  . Essential hypertension 10/12/2010    Priority: High  .  Hypothyroidism due to acquired atrophy of thyroid 10/12/2010    Priority: High  . Aortic stenosis, mild 09/10/2017    Priority: Medium  . Chronic tophaceous gout 07/08/2017    Priority: Medium  . LPRD (laryngopharyngeal reflux disease) 04/18/2015    Priority: Medium  . Moderate persistent asthma 04/18/2015    Priority: Medium  . Colon adenoma 11/03/2013    Priority: Medium  . Osteoarthritis 10/12/2010    Priority: Medium  . Urge incontinence of urine 07/08/2017    Priority: Low  . Other allergic rhinitis 04/18/2015    Priority: Low  . Glaucoma 05/25/2018  . Statin intolerance 05/25/2018  . Subarachnoid hemorrhage following injury ([place]) 01/29/2014   Current Meds  Medication Sig  . albuterol (PROAIR HFA) 108 (90 BASE) MCG/ACT inhaler Inhale 2 puffs into the lungs every 6 (six) hours as needed for wheezing or  shortness of breath.   . Bepotastine Besilate (BEPREVE) 1.5 % SOLN Place 1 drop into both eyes every morning. 1.5 %  . calcium carbonate (OS-CAL) 600 MG TABS tablet Take 600 mg by mouth daily.  . DULERA 200-5 MCG/ACT AERO USE 2 PUFFS WITH SPACER EVERY 12 HOURS TO PREVENT COUGH OR WHEEZE, RINSE, GARGLE AND SPIT AFTER USE  . ezetimibe (ZETIA) 10 MG tablet TAKE 1 TABLET (10 MG) ONCE DAILY  . fexofenadine (ALLEGRA) 180 MG tablet Take 180 mg by mouth daily.  Marland Kitchen GELNIQUE 10 % GEL Apply 1 application topically daily as needed (going to the bathroom as often).   Marland Kitchen latanoprost (XALATAN) 0.005 % ophthalmic solution Place 1 drop into both eyes at bedtime.   . meloxicam (MOBIC) 15 MG tablet Take 1 tablet by mouth daily as needed.  . naproxen sodium (ALEVE) 220 MG tablet Take 220 mg by mouth daily as needed (pain).  Marland Kitchen oxybutynin (DITROPAN) 5 MG tablet TAKE 1 TABLET BY MOUTH TWICE A DAY  . oxybutynin (DITROPAN-XL) 5 MG 24 hr tablet Take 5 mg by mouth at bedtime. PRN can take when she does not want to use gel  . polyethylene glycol (MIRALAX / GLYCOLAX) 17 g packet Take 17 g by mouth daily.  . rosuvastatin (CRESTOR) 5 MG tablet TAKE 1 TABLET BY MOUTH EVERY DAY  . SYNTHROID 112 MCG tablet Take 1 tablet (112 mcg total) by mouth daily.  Marland Kitchen triamterene-hydrochlorothiazide (MAXZIDE-25) 37.5-25 MG tablet TAKE 1 TABLET BY MOUTH EVERY DAY  . [DISCONTINUED] SYNTHROID 112 MCG tablet TAKE 1 TABLET BY MOUTH EVERY DAY    Allergies: Patient is allergic to penicillins; sulfa antibiotics; aspirin; and atorvastatin. Family History: Patient family history includes Alzheimer's disease in her father; Cancer in her sister; Stroke in her mother. Social History:  Patient  reports that she has never smoked. She has never used smokeless tobacco. She reports current alcohol use of about 5.0 standard drinks of alcohol per week. She reports that she does not use drugs.  Review of Systems: Constitutional: Negative for fever malaise or  anorexia Cardiovascular: negative for chest pain Respiratory: negative for SOB or persistent cough Gastrointestinal: negative for abdominal pain  Objective  Vitals: BP 118/64 (BP Location: Left Arm, Patient Position: Sitting, Cuff Size: Normal)   Pulse 69   Temp (!) 97.2 F (36.2 C) (Temporal)   Ht 4\' 11"  (1.499 m)   Wt 211 lb (95.7 kg)   SpO2 98%   BMI 42.62 kg/m  General: no acute distress , A&Ox3 Psych: normal affect. Nl speech, appropriate Get up and go testing is normal Gait: arthritic limp present  HEENT: PEERL, conjunctiva normal, Oropharynx moist,neck is supple Cardiovascular:  RRR  With AS murmur Respiratory:  Good breath sounds bilaterally, CTAB with normal respiratory effort Skin:  Warm, no rashes     Commons side effects, risks, benefits, and alternatives for medications and treatment plan prescribed today were discussed, and the patient expressed understanding of the given instructions. Patient is instructed to call or message via MyChart if he/she has any questions or concerns regarding our treatment plan. No barriers to understanding were identified. We discussed Red Flag symptoms and signs in detail. Patient expressed understanding regarding what to do in case of urgent or emergency type symptoms.   Medication list was reconciled, printed and provided to the patient in AVS. Patient instructions and summary information was reviewed with the patient as documented in the AVS. This note was prepared with assistance of Dragon voice recognition software. Occasional wrong-word or sound-a-like substitutions may have occurred due to the inherent limitations of voice recognition software

## 2019-03-03 NOTE — Patient Instructions (Signed)
Please return in 3-4 weeks for recheck.   Start taking the antidepressant each night. It should also help with your sleep.   If you have any questions or concerns, please don't hesitate to send me a message via MyChart or call the office at [phone]. Thank you for visiting with [place] today! It's our pleasure caring for you.  Depression Medications:  Taking the medicine as directed and not missing any doses is one of the best things you can do to treat your depression.  Here are some things to keep in mind:  1) Side effects (stomach upset, some increased anxiety) may happen before you notice a benefit.  These side effects typically go away over time. 2) Changes to your dose of medicine or a change in medication all together is sometimes necessary 3) Most people need to be on medication at least 6-12 months 4) Many people will notice an improvement within two weeks but the full effect of the medication can take up to 4-6 weeks 5) Stopping the medication when you start feeling better often results in a return of symptoms 6) If you start having thoughts of hurting yourself or others after starting this medicine, please call the office immediately at [phone].

## 2019-03-04 LAB — CBC WITH DIFFERENTIAL/PLATELET
Basophils Absolute: 0.1 10*3/uL (ref 0.0–0.1)
Basophils Relative: 1.5 % (ref 0.0–3.0)
Eosinophils Absolute: 0.2 10*3/uL (ref 0.0–0.7)
Eosinophils Relative: 3.2 % (ref 0.0–5.0)
HCT: 37.9 % (ref 36.0–46.0)
Hemoglobin: 13 g/dL (ref 12.0–15.0)
Lymphocytes Relative: 29.1 % (ref 12.0–46.0)
Lymphs Abs: 1.8 10*3/uL (ref 0.7–4.0)
MCHC: 34.3 g/dL (ref 30.0–36.0)
MCV: 92 fl (ref 78.0–100.0)
Monocytes Absolute: 0.6 10*3/uL (ref 0.1–1.0)
Monocytes Relative: 8.9 % (ref 3.0–12.0)
Neutro Abs: 3.6 10*3/uL (ref 1.4–7.7)
Neutrophils Relative %: 57.3 % (ref 43.0–77.0)
Platelets: 207 10*3/uL (ref 150.0–400.0)
RBC: 4.12 Mil/uL (ref 3.87–5.11)
RDW: 13.8 % (ref 11.5–15.5)
WBC: 6.2 10*3/uL (ref 4.0–10.5)

## 2019-03-04 LAB — TSH: TSH: 2.25 u[IU]/mL (ref 0.35–4.50)

## 2019-03-04 NOTE — Progress Notes (Signed)
Please call patient: I have reviewed his/her lab results. Her thyroid and blood counts are perfect. The new medication should help her mood.

## 2019-03-22 ENCOUNTER — Other Ambulatory Visit: Payer: Self-pay

## 2019-03-22 ENCOUNTER — Ambulatory Visit (INDEPENDENT_AMBULATORY_CARE_PROVIDER_SITE_OTHER): Payer: Medicare Other

## 2019-03-22 VITALS — BP 126/64 | HR 68 | Temp 97.8°F | Ht 59.0 in | Wt 207.0 lb

## 2019-03-22 DIAGNOSIS — Z0000 Encounter for general adult medical examination without abnormal findings: Secondary | ICD-10-CM | POA: Diagnosis not present

## 2019-03-22 NOTE — Progress Notes (Signed)
Subjective:   Rebecca Hubbard is a 78 y.o. female who presents for Medicare Annual (Subsequent) preventive examination.  Review of Systems:   Cardiac Risk Factors include: advanced age (>20men, >81 women);hypertension    Objective:     Vitals: BP 126/64   Pulse 68   Temp 97.8 F (36.6 C)   Ht 4\' 11"  (1.499 m)   Wt 207 lb (93.9 kg)   SpO2 96%   BMI 41.81 kg/m   Body mass index is 41.81 kg/m.  Advanced Directives 03/22/2019 01/28/2014  Does Patient Have a Medical Advance Directive? No No  Would patient like information on creating a medical advance directive? Yes (MAU/Ambulatory/Procedural Areas - Information given) Yes - Educational materials given    Tobacco Social History   Tobacco Use  Smoking Status Never Smoker  Smokeless Tobacco Never Used     Counseling given: Not Answered   Clinical Intake:  Pre-visit preparation completed: Yes  Pain : No/denies pain  Diabetes: No  How often do you need to have someone help you when you read instructions, pamphlets, or other written materials from your doctor or pharmacy?: 1 - Never  Interpreter Needed?: No  Information entered by :: Denman George LPN  Past Medical History:  Diagnosis Date  . Asthma   . Depression, recurrent ([place]) 05/25/2018   Sept  - January: since divorcing her husband many years ago.   Marland Kitchen GERD (gastroesophageal reflux disease)   . Glaucoma   . Hyperlipidemia   . Hypertension   . Hypothyroidism   . Seasonal allergies    Past Surgical History:  Procedure Laterality Date  . ABDOMINAL HYSTERECTOMY    . THYROIDECTOMY     Family History  Problem Relation Age of Onset  . Stroke Mother   . Alzheimer's disease Father   . Cancer Sister    Social History   Socioeconomic History  . Marital status: Widowed    Spouse name: Not on file  . Number of children: 2  . Years of education: College  . Highest education level: Not on file  Occupational History  . Occupation: retired   Fish farm manager: OTHER    Comment: Presenter, broadcasting   Social Needs  . Financial resource strain: Not on file  . Food insecurity    Worry: Not on file    Inability: Not on file  . Transportation needs    Medical: Not on file    Non-medical: Not on file  Tobacco Use  . Smoking status: Never Smoker  . Smokeless tobacco: Never Used  Substance and Sexual Activity  . Alcohol use: Yes    Alcohol/week: 5.0 standard drinks    Types: 5 Cans of beer per week  . Drug use: No  . Sexual activity: Not Currently  Lifestyle  . Physical activity    Days per week: Not on file    Minutes per session: Not on file  . Stress: Not on file  Relationships  . Social Herbalist on phone: Not on file    Gets together: Not on file    Attends religious service: Not on file    Active member of club or organization: Not on file    Attends meetings of clubs or organizations: Not on file    Relationship status: Not on file  Other Topics Concern  . Not on file  Social History Narrative   Patient lives at home with daughter.   Caffeine Use: 1.5 cup daily  Outpatient Encounter Medications as of 03/22/2019  Medication Sig  . albuterol (PROAIR HFA) 108 (90 BASE) MCG/ACT inhaler Inhale 2 puffs into the lungs every 6 (six) hours as needed for wheezing or shortness of breath.   . Bepotastine Besilate (BEPREVE) 1.5 % SOLN Place 1 drop into both eyes every morning. 1.5 %  . calcium carbonate (OS-CAL) 600 MG TABS tablet Take 600 mg by mouth daily.  . DULERA 200-5 MCG/ACT AERO USE 2 PUFFS WITH SPACER EVERY 12 HOURS TO PREVENT COUGH OR WHEEZE, RINSE, GARGLE AND SPIT AFTER USE  . ezetimibe (ZETIA) 10 MG tablet TAKE 1 TABLET (10 MG) ONCE DAILY  . fexofenadine (ALLEGRA) 180 MG tablet Take 180 mg by mouth daily.  Marland Kitchen GELNIQUE 10 % GEL Apply 1 application topically daily as needed (going to the bathroom as often).   Marland Kitchen latanoprost (XALATAN) 0.005 % ophthalmic solution Place 1 drop into both eyes at bedtime.   .  meloxicam (MOBIC) 15 MG tablet Take 1 tablet by mouth daily as needed.  . naproxen sodium (ALEVE) 220 MG tablet Take 220 mg by mouth daily as needed (pain).  Marland Kitchen oxybutynin (DITROPAN) 5 MG tablet TAKE 1 TABLET BY MOUTH TWICE A DAY  . oxybutynin (DITROPAN-XL) 5 MG 24 hr tablet Take 5 mg by mouth at bedtime. PRN can take when she does not want to use gel  . polyethylene glycol (MIRALAX / GLYCOLAX) 17 g packet Take 17 g by mouth daily.  . rosuvastatin (CRESTOR) 5 MG tablet TAKE 1 TABLET BY MOUTH EVERY DAY  . SYNTHROID 112 MCG tablet Take 1 tablet (112 mcg total) by mouth daily.  Marland Kitchen triamterene-hydrochlorothiazide (MAXZIDE-25) 37.5-25 MG tablet TAKE 1 TABLET BY MOUTH EVERY DAY  . mirtazapine (REMERON) 15 MG tablet Take 1 tablet (15 mg total) by mouth at bedtime. (Patient not taking: Reported on 03/22/2019)   No facility-administered encounter medications on file as of 03/22/2019.     Activities of Daily Living In your present state of health, do you have any difficulty performing the following activities: 03/22/2019  Hearing? N  Vision? N  Difficulty concentrating or making decisions? N  Walking or climbing stairs? N  Dressing or bathing? N  Doing errands, shopping? N  Preparing Food and eating ? N  Using the Toilet? N  In the past six months, have you accidently leaked urine? N  Do you have problems with loss of bowel control? N  Managing your Medications? N  Managing your Finances? N  Housekeeping or managing your Housekeeping? N  Some recent data might be hidden    Patient Care Team: Leamon Arnt, MD as PCP - General (Family Medicine) Calvert Cantor, MD as Consulting Physician (Ophthalmology)    Assessment:   This is a routine wellness examination for [facility].  Exercise Activities and Dietary recommendations Current Exercise Habits: Structured exercise class, Type of exercise: Other - see comments(water aerobics), Time (Minutes): 45, Frequency (Times/Week): 3, Weekly Exercise  (Minutes/Week): 135, Intensity: Moderate  Goals   None     Fall Risk Fall Risk  03/22/2019 09/23/2018 05/25/2018  Falls in the past year? 1 0 0  Number falls in past yr: 0 0 0  Injury with Fall? 1 0 0  Risk for fall due to : History of fall(s) - -  Follow up Falls evaluation completed;Education provided;Falls prevention discussed Falls evaluation completed Falls evaluation completed   Is the patient's home free of loose throw rugs in walkways, pet beds, electrical cords, etc?   Yes  Grab bars in the bathroom? yes      Handrails on the stairs?   yes      Adequate lighting?   yes  Timed Get Up and Go performed: completed and within normal timeframe; no gait abnormalities noted   Depression Screen- patient states that she has had improvement since last office visit without the use of recently prescribed Remeron; she did not start this medication PHQ 2/9 Scores 03/22/2019 03/03/2019 09/23/2018 05/25/2018  PHQ - 2 Score 1 4 0 1  PHQ- 9 Score 2 13 - 4     Cognitive Function-no cognitive concerns at this time  MMSE - [place] Exam 03/22/2019  Orientation to time 5  Orientation to Place 5  Registration 3  Attention/ Calculation 5  Recall 3  Language- name 2 objects 2  Language- repeat 1  Language- follow 3 step command 3  Language- read & follow direction 1  Write a sentence 1  Copy design 1  Total score 30        Immunization History  Administered Date(s) Administered  . Influenza Split 03/26/2012  . Influenza, High Dose Seasonal PF 03/15/2014, 12/29/2014, 02/05/2016, 02/25/2017, 01/22/2018, 01/22/2019, 01/22/2019  . Pneumococcal Conjugate-13 01/27/2014  . Pneumococcal Polysaccharide-23 03/23/2012, 03/26/2012  . Zoster Recombinat (Shingrix) 09/24/2018    Qualifies for Shingles Vaccine? Discussed with patient the need to complete Shingrix series.  She voiced understanding and will return to pharmacy to receive 2nd injection   Screening Tests Health Maintenance   Topic Date Due  . TETANUS/TDAP  01/27/1960  . INFLUENZA VACCINE  Completed  . PNA vac Low Risk Adult  Completed  . DEXA SCAN  Discontinued    Cancer Screenings: Lung: Low Dose CT Chest recommended if Age 35-80 years, 30 pack-year currently smoking OR have quit w/in 15years. Patient does not qualify. Breast:  Up to date on Mammogram? Yes   Up to date of Bone Density/Dexa? Yes Colorectal: No longer indicated; last colonoscopy 01/11/16      Plan:  I have personally reviewed and addressed the Medicare Annual Wellness questionnaire and have noted the following in the patient's chart:  A. Medical and social history B. Use of alcohol, tobacco or illicit drugs  C. Current medications and supplements D. Functional ability and status E.  Nutritional status F.  Physical activity G. Advance directives H. List of other physicians I.  Hospitalizations, surgeries, and ER visits in previous 12 months J.  [place] such as hearing and vision if needed, cognitive and depression L. Referrals, records requested, and appointments- none   In addition, I have reviewed and discussed with patient certain preventive protocols, quality metrics, and best practice recommendations. A written personalized care plan for preventive services as well as general preventive health recommendations were provided to patient.   Signed,  Denman George, LPN  Nurse Health Advisor   Nurse Notes: Patient requested to cancel upcoming appointment for 03/30/19.  States that her mood has improved without medication.  When would you like to see patient for next visit?

## 2019-03-22 NOTE — Patient Instructions (Addendum)
[name] , Thank you for taking time to come for your Medicare Wellness Visit. I appreciate your ongoing commitment to your health goals. Please review the following plan we discussed and let me know if I can assist you in the future.   Screening recommendations/referrals: Colorectal Screening: up to date; last 01/11/16 Mammogram: up to date; no longer indicated  Bone Density: up to date   Vision and Dental Exams: Recommended annual ophthalmology exams for early detection of glaucoma and other disorders of the eye Recommended annual dental exams for proper oral hygiene  Vaccinations: Influenza vaccine: completed 01/22/19 Pneumococcal vaccine: up to date; last 01/27/14 Tdap vaccine: recommended; Please call your insurance company to determine your out of pocket expense. You may also receive this vaccine at your local pharmacy or Health Dept. Shingles vaccine: Please complete series of 2 vaccines   Advanced directives: Please bring a copy of your POA (Power of Attorney) and/or Living Will to your next appointment.  Goals: Recommend to drink at least 6-8 8oz glasses of water per day and consume a balanced diet rich in fresh fruits and vegetables.   Next appointment: Please schedule your Annual Wellness Visit with your Nurse Health Advisor in one year.  I will be in touch to let you know when Dr. Jonni Sanger would like to see you again.   Preventive Care 75 Years and Older, Female Preventive care refers to lifestyle choices and visits with your health care provider that can promote health and wellness. What does preventive care include?  A yearly physical exam. This is also called an annual well check.  Dental exams once or twice a year.  Routine eye exams. Ask your health care provider how often you should have your eyes checked.  Personal lifestyle choices, including:  Daily care of your teeth and gums.  Regular physical activity.  Eating a healthy diet.  Avoiding tobacco and  drug use.  Limiting alcohol use.  Practicing safe sex.  Taking low-dose aspirin every day if recommended by your health care provider.  Taking vitamin and mineral supplements as recommended by your health care provider. What happens during an annual well check? The services and screenings done by your health care provider during your annual well check will depend on your age, overall health, lifestyle risk factors, and family history of disease. Counseling  Your health care provider may ask you questions about your:  Alcohol use.  Tobacco use.  Drug use.  Emotional well-being.  Home and relationship well-being.  Sexual activity.  Eating habits.  History of falls.  Memory and ability to understand (cognition).  Work and work Statistician.  Reproductive health. Screening  You may have the following tests or measurements:  Height, weight, and BMI.  Blood pressure.  Lipid and cholesterol levels. These may be checked every 5 years, or more frequently if you are over 69 years old.  Skin check.  Lung cancer screening. You may have this screening every year starting at age 58 if you have a 30-pack-year history of smoking and currently smoke or have quit within the past 15 years.  Fecal occult blood test (FOBT) of the stool. You may have this test every year starting at age 90.  Flexible sigmoidoscopy or colonoscopy. You may have a sigmoidoscopy every 5 years or a colonoscopy every 10 years starting at age 74.  Hepatitis C blood test.  Hepatitis B blood test.  Sexually transmitted disease (STD) testing.  Diabetes screening. This is done by checking your blood  sugar (glucose) after you have not eaten for a while (fasting). You may have this done every 1-3 years.  Bone density scan. This is done to screen for osteoporosis. You may have this done starting at age 14.  Mammogram. This may be done every 1-2 years. Talk to your health care provider about how often you  should have regular mammograms. Talk with your health care provider about your test results, treatment options, and if necessary, the need for more tests. Vaccines  Your health care provider may recommend certain vaccines, such as:  Influenza vaccine. This is recommended every year.  Tetanus, diphtheria, and acellular pertussis (Tdap, Td) vaccine. You may need a Td booster every 10 years.  Zoster vaccine. You may need this after age 26.  Pneumococcal 13-valent conjugate (PCV13) vaccine. One dose is recommended after age 69.  Pneumococcal polysaccharide (PPSV23) vaccine. One dose is recommended after age 58. Talk to your health care provider about which screenings and vaccines you need and how often you need them. This information is not intended to replace advice given to you by your health care provider. Make sure you discuss any questions you have with your health care provider. Document Released: 04/28/2015 Document Revised: 12/20/2015 Document Reviewed: 01/31/2015 Elsevier Interactive Patient Education  2017 [place] Prevention in the Home Falls can cause injuries. They can happen to people of all ages. There are many things you can do to make your home safe and to help prevent falls. What can I do on the outside of my home?  Regularly fix the edges of walkways and driveways and fix any cracks.  Remove anything that might make you trip as you walk through a door, such as a raised step or threshold.  Trim any bushes or trees on the path to your home.  Use bright outdoor lighting.  Clear any walking paths of anything that might make someone trip, such as rocks or tools.  Regularly check to see if handrails are loose or broken. Make sure that both sides of any steps have handrails.  Any raised decks and porches should have guardrails on the edges.  Have any leaves, snow, or ice cleared regularly.  Use sand or salt on walking paths during winter.  Clean up any  spills in your garage right away. This includes oil or grease spills. What can I do in the bathroom?  Use night lights.  Install grab bars by the toilet and in the tub and shower. Do not use towel bars as grab bars.  Use non-skid mats or decals in the tub or shower.  If you need to sit down in the shower, use a plastic, non-slip stool.  Keep the floor dry. Clean up any water that spills on the floor as soon as it happens.  Remove soap buildup in the tub or shower regularly.  Attach bath mats securely with double-sided non-slip rug tape.  Do not have throw rugs and other things on the floor that can make you trip. What can I do in the bedroom?  Use night lights.  Make sure that you have a light by your bed that is easy to reach.  Do not use any sheets or blankets that are too big for your bed. They should not hang down onto the floor.  Have a firm chair that has side arms. You can use this for support while you get dressed.  Do not have throw rugs and other things on the floor that can  make you trip. What can I do in the kitchen?  Clean up any spills right away.  Avoid walking on wet floors.  Keep items that you use a lot in easy-to-reach places.  If you need to reach something above you, use a strong step stool that has a grab bar.  Keep electrical cords out of the way.  Do not use floor polish or wax that makes floors slippery. If you must use wax, use non-skid floor wax.  Do not have throw rugs and other things on the floor that can make you trip. What can I do with my stairs?  Do not leave any items on the stairs.  Make sure that there are handrails on both sides of the stairs and use them. Fix handrails that are broken or loose. Make sure that handrails are as long as the stairways.  Check any carpeting to make sure that it is firmly attached to the stairs. Fix any carpet that is loose or worn.  Avoid having throw rugs at the top or bottom of the stairs. If you  do have throw rugs, attach them to the floor with carpet tape.  Make sure that you have a light switch at the top of the stairs and the bottom of the stairs. If you do not have them, ask someone to add them for you. What else can I do to help prevent falls?  Wear shoes that:  Do not have high heels.  Have rubber bottoms.  Are comfortable and fit you well.  Are closed at the toe. Do not wear sandals.  If you use a stepladder:  Make sure that it is fully opened. Do not climb a closed stepladder.  Make sure that both sides of the stepladder are locked into place.  Ask someone to hold it for you, if possible.  Clearly mark and make sure that you can see:  Any grab bars or handrails.  First and last steps.  Where the edge of each step is.  Use tools that help you move around (mobility aids) if they are needed. These include:  Canes.  Walkers.  Scooters.  Crutches.  Turn on the lights when you go into a dark area. Replace any light bulbs as soon as they burn out.  Set up your furniture so you have a clear path. Avoid moving your furniture around.  If any of your floors are uneven, fix them.  If there are any pets around you, be aware of where they are.  Review your medicines with your doctor. Some medicines can make you feel dizzy. This can increase your chance of falling. Ask your doctor what other things that you can do to help prevent falls. This information is not intended to replace advice given to you by your health care provider. Make sure you discuss any questions you have with your health care provider. Document Released: 01/26/2009 Document Revised: 09/07/2015 Document Reviewed: 05/06/2014 Elsevier Interactive Patient Education  2017 [employer].

## 2019-03-25 ENCOUNTER — Ambulatory Visit: Payer: Medicare Other | Admitting: Family Medicine

## 2019-03-30 ENCOUNTER — Ambulatory Visit: Payer: Medicare Other | Admitting: Family Medicine

## 2019-10-08 ENCOUNTER — Telehealth: Payer: Self-pay | Admitting: Family Medicine

## 2019-10-08 ENCOUNTER — Other Ambulatory Visit: Payer: Self-pay

## 2019-10-08 DIAGNOSIS — R413 Other amnesia: Secondary | ICD-10-CM

## 2019-10-08 NOTE — Telephone Encounter (Signed)
Pt daughter called stating her mom fell about 5 years ago and experienced a brain bleed. Daughter states she was a patient at [facility]. Pt has been experiencing some memory issues and daughter states she is starting to see symptoms of dementia. Daughter is asking if Dr. Jonni Sanger can place a referral to [facility] for pt to be seen. Please advise.

## 2019-10-08 NOTE — Telephone Encounter (Signed)
Referral placed.
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
## 2019-12-07 ENCOUNTER — Ambulatory Visit (INDEPENDENT_AMBULATORY_CARE_PROVIDER_SITE_OTHER): Payer: Medicare Other | Admitting: Neurology

## 2019-12-07 ENCOUNTER — Other Ambulatory Visit: Payer: Self-pay

## 2019-12-07 ENCOUNTER — Telehealth: Payer: Self-pay | Admitting: Neurology

## 2019-12-07 ENCOUNTER — Encounter: Payer: Self-pay | Admitting: Neurology

## 2019-12-07 VITALS — BP 128/67 | HR 63 | Ht 60.0 in | Wt 195.0 lb

## 2019-12-07 DIAGNOSIS — G309 Alzheimer's disease, unspecified: Secondary | ICD-10-CM | POA: Diagnosis not present

## 2019-12-07 DIAGNOSIS — R4189 Other symptoms and signs involving cognitive functions and awareness: Secondary | ICD-10-CM

## 2019-12-07 DIAGNOSIS — W19XXXA Unspecified fall, initial encounter: Secondary | ICD-10-CM

## 2019-12-07 DIAGNOSIS — Z820 Family history of epilepsy and other diseases of the nervous system: Secondary | ICD-10-CM

## 2019-12-07 DIAGNOSIS — R413 Other amnesia: Secondary | ICD-10-CM | POA: Diagnosis not present

## 2019-12-07 DIAGNOSIS — R4689 Other symptoms and signs involving appearance and behavior: Secondary | ICD-10-CM

## 2019-12-07 NOTE — Patient Instructions (Addendum)
MRI of the brain w/wo contrast to look for reversible causes of dementia Blood work for reversible causes of dementia Formal memory testing: Dr. Ferne Coe team FDG PET Scan The XX brain  Driving only during the day Drive only to familiar Locations Avoid driving during bad weather  Recommendations to prevent or slow progression of cognitive decline:   Exercise You should increase exercise 30 to 45 minutes per day at least 3 days a week although 5 to 7 would be preferred. Any type of exercise (including walking) is acceptable although a recumbent bicycle may be best if you are unsteady. Disease related apathy can be a significant roadblock to exercise and the only way to overcome this is to make it a daily routine and perhaps have a reward at the end (something your loved one loves to eat or drink perhaps) or a personal trainer coming to the home can also be very useful. In general a structured, repetitive schedule is best.   Cardiovascular Health: You should optimize all cardiovascular risk factors (blood pressure, sugar, cholesterol) as vascular disease such as strokes and heart attacks can make memory problems much worse.   Diet: Eating a heart healthy (Mediterranean) diet is also a good idea; fish and poultry instead of red meat, nuts (mostly non-peanuts), vegetables, fruits, olive oil or canola oil (instead of butter), minimal salt (use other spices to flavor foods), whole grain rice, bread, cereal and pasta and wine in moderation.  General Health: Any diseases which effect your body will effect your brain such as a pneumonia, urinary infection, blood clot, heart attack or stroke. Keep contact with your primary care doctor for regular follow ups.  Sleep. A good nights sleep is healthy for the brain. Seven hours is recommended. If you have insomnia or poor sleep habits see the recommendations below  Tips: Structured and consistent daytime and nighttime routine, including regular wake  times, bedtimes, and mealtimes, will be important for the patient to avoid confusion. Keeping frequently used items in designated places will help reduce stress from searching. If there are worries about getting lost do not let the patient leave home unaccompanied. They might benefit from wearing an identification bracelet that will help others assist in finding home if they become lost. Information about nationwide safe return services and other helpful resources may be obtained through the Alzheimer's Association helpline at 1800-317-886-8460.  Finances, Power of Producer, television/film/video Directives: You should consider putting legal safeguards in place with regard to financial and medical decision making. While the spouse always has power of attorney for medical and financial issues in the absence of any form, you should consider what you want in case the spouse / caregiver is no longer around or capable of making decisions.   [place] : http://www.welch.com/.pdf  Or Google "[place]" AND "An Forensic scientist for [employer]  Other States: ApartmentMom.com.ee  The signature on these forms should be notarized.   DRIVING:   Driving only during the day Drive only to familiar Locations Avoid driving during bad weather  If you would like to be tested to see if you are driving safely, [facility] has a Clinical Driving Evaluation. To schedule an appointment call [phone].                RESOURCES:  Memory Loss: Improve your short term memory By Silvio Pate  The Alzheimer's Reading Room http://www.alzheimersreadingroom.com/   The Alzheimer's Compendium http://www.alzcompend.info/  [employer] www.dukefamilysupport.org [phone]  Recommended resources for caregivers (All can  be purchased on [employer]):  1) A Caregiver's Guide to  Dementia: Using Activities and Other Strategies to Prevent, Reduce and Manage Behavioral Symptoms by Osie Bond. Tyler Aas and [employer]   2) A Caregiver's Guide to [employer] Dementia by Caleen Essex MS BSN and Gaston Islam   3) What If It's Not Alzheimer's?: A Caregiver's Guide to Dementia by Koren Shiver (Author), Octaviano Batty (Editor)  3) The 36 hour day by Rabins and Mace  4) Understanding Difficult Behaviors by Merita Norton and White  Online course for helping caregivers reduce stress, guilt and frustration called the Caregivers Helpbook. The website is www.powerfultoolsforcaregivers.org  As a caregiver you are a Art gallery manager. Problems you face as a caregiver are usually unique to your situation and the way your loved-one's disease manifests itself. The best way to use these books is to look at the Table of Contents and read any chapters of interest or that apply to challenges you are having as a caregiver.  NATIONAL RESOURCES: For more information on neurological disorders or research programs funded by the [employer] of Neurological Disorders and Stroke, contact the Institute's Agricultural consultant (BRAIN) at: [place] [address] [phone] (toll-free) MasterBoxes.it  Information on dementia is also available from the following organizations: Alzheimer's Disease Education and Referral ([place]) [employer] on Aging [address] [place] [address] [phone] (toll-free) DVDEnthusiasts.nl  Alzheimer's Association [address] [place] [phone] (toll-free, 24-hour helpline) [phone] (TDD) CapitalMile.co.nz  Alzheimer's Foundation of America [address], [place] [address] [phone] (toll-free) www.alzfdn.org  [facility] [address] [place]  [address] [phone] www.alzdiscovery.org  Association for [address], [place] [address] [phone] (toll-free) www.[place] [address] ([phone] (toll-free) www.brightfocus.org/alzheimers  Doran Stabler [facility] Alzheimer's Foundation [address] [place] [address] [phone] www.https://lambert-jackson.net/  Lewy Body Dementia Association [address] [place] [address] [phone] (toll-free LBD Caregiver Link) www.lbda.[place], [place], [place] [address] [phone]) [phone] (toll-free) [phone] [facility]) https://carter.com/  National Organization for Rare Disorders [address] [place] [address] 8-453-646-OEHO [phone]) (toll-free) www.rarediseases.org  The Dementias: Hope Through Research was jointly produced by the [employer] of Neurological Disorders and Stroke (NINDS) and the [employer] on Aging (NIA), both part of the [employer], the [employer] research agency--supporting scientific studies that turn discovery into health. NINDS is the nation's leading funder of research on the brain and nervous system. The NINDS mission is to reduce the burden of neurological disease. For more information and resources, visit MasterBoxes.it [1] or call 240-563-5589. NIA leads the federal government effort conducting and supporting research on aging and the health and well-being of older people. NIA's Alzheimer's Disease Education and Referral (ADEAR) Center offers information and publications on dementia and caregiving for families, caregivers, and professionals. For more information, visit DVDEnthusiasts.nl [2] or call ([phone])285-9102. Also available from NIA are  publications and information about Alzheimer's disease as well as the booklets Frontotemporal Disorders: Information for Patients, Families, and Caregivers and Lewy Body Dementia: Information for Patients, Families, and Professionals. Source URL: SocialSpecialists.co.nz

## 2019-12-07 NOTE — Telephone Encounter (Signed)
Medicare/UMR Auth: NPR Ref # Myles C order sent to [facility]. They will reach out to the patient to schedule.

## 2019-12-09 ENCOUNTER — Ambulatory Visit
Admission: RE | Admit: 2019-12-09 | Discharge: 2019-12-09 | Disposition: A | Discharge status: Home / Self Care | Payer: Medicare Other | Source: Ambulatory Visit | Attending: Neurology | Admitting: Neurology

## 2019-12-09 DIAGNOSIS — R413 Other amnesia: Secondary | ICD-10-CM

## 2019-12-09 DIAGNOSIS — W19XXXA Unspecified fall, initial encounter: Secondary | ICD-10-CM

## 2019-12-09 DIAGNOSIS — Z820 Family history of epilepsy and other diseases of the nervous system: Secondary | ICD-10-CM

## 2019-12-09 DIAGNOSIS — R4189 Other symptoms and signs involving cognitive functions and awareness: Secondary | ICD-10-CM

## 2019-12-09 MED ORDER — GADOBENATE DIMEGLUMINE 529 MG/ML IV SOLN
18.0000 mL | Freq: Once | INTRAVENOUS | Status: AC | PRN
  Administered 2019-12-09: 18 mL via INTRAVENOUS

## 2019-12-11 LAB — COMPREHENSIVE METABOLIC PANEL
ALT: 13 IU/L (ref 0–32)
AST: 12 IU/L (ref 0–40)
Albumin/Globulin Ratio: 1.4 (ref 1.2–2.2)
Albumin: 3.8 g/dL (ref 3.7–4.7)
Alkaline Phosphatase: 122 IU/L — ABNORMAL HIGH (ref 48–121)
BUN/Creatinine Ratio: 12 (ref 12–28)
BUN: 9 mg/dL (ref 8–27)
Bilirubin Total: 0.6 mg/dL (ref 0.0–1.2)
CO2: 27 mmol/L (ref 20–29)
Calcium: 8.5 mg/dL — ABNORMAL LOW (ref 8.7–10.3)
Chloride: 98 mmol/L (ref 96–106)
Creatinine, Ser: 0.77 mg/dL (ref 0.57–1.00)
GFR calc Af Amer: 84 mL/min/{1.73_m2} (ref >59)
GFR calc non Af Amer: 73 mL/min/{1.73_m2} (ref >59)
Globulin, Total: 2.7 g/dL (ref 1.5–4.5)
Glucose: 104 mg/dL — ABNORMAL HIGH (ref 65–99)
Potassium: 3.5 mmol/L (ref 3.5–5.2)
Sodium: 138 mmol/L (ref 134–144)
Total Protein: 6.5 g/dL (ref 6.0–8.5)

## 2019-12-11 LAB — CBC WITH DIFFERENTIAL/PLATELET
Basophils Absolute: 0 10*3/uL (ref 0.0–0.2)
Basos: 1 %
EOS (ABSOLUTE): 0.2 10*3/uL (ref 0.0–0.4)
Eos: 4 %
Hematocrit: 39.8 % (ref 34.0–46.6)
Hemoglobin: 13.3 g/dL (ref 11.1–15.9)
Immature Grans (Abs): 0 10*3/uL (ref 0.0–0.1)
Immature Granulocytes: 1 %
Lymphocytes Absolute: 1.7 10*3/uL (ref 0.7–3.1)
Lymphs: 29 %
MCH: 30.6 pg (ref 26.6–33.0)
MCHC: 33.4 g/dL (ref 31.5–35.7)
MCV: 92 fL (ref 79–97)
Monocytes Absolute: 0.6 10*3/uL (ref 0.1–0.9)
Monocytes: 10 %
Neutrophils Absolute: 3.5 10*3/uL (ref 1.4–7.0)
Neutrophils: 55 %
Platelets: 224 10*3/uL (ref 150–450)
RBC: 4.34 x10E6/uL (ref 3.77–5.28)
RDW: 13.7 % (ref 11.7–15.4)
WBC: 6.1 10*3/uL (ref 3.4–10.8)

## 2019-12-11 LAB — B12 AND FOLATE PANEL
Folate: 5 ng/mL (ref >3.0)
Vitamin B-12: 442 pg/mL (ref 232–1245)

## 2019-12-11 LAB — VITAMIN B1: Thiamine: 91.7 nmol/L (ref 66.5–200.0)

## 2019-12-11 LAB — HOMOCYSTEINE: Homocysteine: 16.6 umol/L (ref 0.0–19.2)

## 2019-12-11 LAB — METHYLMALONIC ACID, SERUM: Methylmalonic Acid: 188 nmol/L (ref 0–378)

## 2019-12-11 LAB — TSH: TSH: 0.183 u[IU]/mL — ABNORMAL LOW (ref 0.450–4.500)

## 2019-12-13 ENCOUNTER — Other Ambulatory Visit: Payer: Self-pay

## 2019-12-13 ENCOUNTER — Ambulatory Visit (HOSPITAL_COMMUNITY)
Admission: RE | Admit: 2019-12-13 | Discharge: 2019-12-13 | Disposition: A | Discharge status: Home / Self Care | Payer: Medicare Other | Source: Ambulatory Visit | Attending: Neurology | Admitting: Neurology

## 2019-12-13 DIAGNOSIS — W19XXXA Unspecified fall, initial encounter: Secondary | ICD-10-CM | POA: Diagnosis not present

## 2019-12-13 DIAGNOSIS — R4189 Other symptoms and signs involving cognitive functions and awareness: Secondary | ICD-10-CM | POA: Diagnosis not present

## 2019-12-13 DIAGNOSIS — R4689 Other symptoms and signs involving appearance and behavior: Secondary | ICD-10-CM | POA: Diagnosis not present

## 2019-12-13 DIAGNOSIS — Z820 Family history of epilepsy and other diseases of the nervous system: Secondary | ICD-10-CM | POA: Insufficient documentation

## 2019-12-13 DIAGNOSIS — R413 Other amnesia: Secondary | ICD-10-CM | POA: Diagnosis not present

## 2019-12-13 DIAGNOSIS — G309 Alzheimer's disease, unspecified: Secondary | ICD-10-CM | POA: Insufficient documentation

## 2019-12-13 LAB — GLUCOSE, CAPILLARY: Glucose-Capillary: 100 mg/dL — ABNORMAL HIGH (ref 70–99)

## 2019-12-13 MED ORDER — FLUDEOXYGLUCOSE F - 18 (FDG) INJECTION
9.2000 | Freq: Once | INTRAVENOUS | Status: AC | PRN
  Administered 2019-12-13: 9.2 via INTRAVENOUS

## 2019-12-16 ENCOUNTER — Telehealth: Payer: Self-pay | Admitting: *Deleted

## 2019-12-16 ENCOUNTER — Encounter: Payer: Self-pay | Admitting: Psychology

## 2019-12-16 NOTE — Telephone Encounter (Signed)
Attempted to reach daughter, [name]. Phone continuously rang.

## 2019-12-21 MED ORDER — MEMANTINE HCL 10 MG PO TABS: 10.0000 mg | ORAL_TABLET | Freq: Two times a day (BID) | ORAL | 2 refills | Status: DC

## 2019-12-21 NOTE — Addendum Note (Signed)
Addended by: Gildardo Griffes on: 12/21/2019 07:54 AM   Modules accepted: Orders

## 2019-12-30 ENCOUNTER — Encounter: Payer: Self-pay | Admitting: Physician Assistant

## 2019-12-30 ENCOUNTER — Other Ambulatory Visit: Payer: Self-pay

## 2019-12-30 ENCOUNTER — Ambulatory Visit (INDEPENDENT_AMBULATORY_CARE_PROVIDER_SITE_OTHER): Payer: Medicare Other | Admitting: Physician Assistant

## 2019-12-30 DIAGNOSIS — D485 Neoplasm of uncertain behavior of skin: Secondary | ICD-10-CM | POA: Diagnosis not present

## 2019-12-30 DIAGNOSIS — L309 Dermatitis, unspecified: Secondary | ICD-10-CM | POA: Diagnosis not present

## 2019-12-30 MED ORDER — BETAMETHASONE DIPROPIONATE 0.05 % EX CREA: TOPICAL_CREAM | Freq: Two times a day (BID) | CUTANEOUS | 2 refills | Status: DC | PRN

## 2019-12-30 NOTE — Patient Instructions (Signed)

## 2020-01-01 NOTE — Progress Notes (Signed)
   New Patient Visit  Subjective  Rebecca Hubbard is a 79 y.o. female who presents for the following: Skin Problem (spot behind both ears--noticed 5 to 6 months for right ear first--mole left temple(noticed for a year)). Warty bump on left temple for a few months. Keeps getting larger. Rash behind her ears itches. She has tried hydrocortisone cream.  Objective  Well appearing patient in no apparent distress; mood and affect are within normal limits.  A focused examination was performed including face and neck. Relevant physical exam findings are noted in the Assessment and Plan.  Objective  Left Forehead: Warty papule     Objective  Left Submandibular Area, Right Postauricular Area: Hyperpigmented scaling patch. This seems like it may be contact of eczema. If contact we were unable to decide what it may be from. We questioned he earrings but that didn't seem to fit the pattern.  Assessment & Plan  Neoplasm of uncertain behavior of skin Left Forehead  Skin / nail biopsy Type of biopsy: tangential   Informed consent: discussed and consent obtained   Timeout: patient name, date of birth, surgical site, and procedure verified   Procedure prep:  Patient was prepped and draped in usual sterile fashion (Non sterile) Prep type:  Chlorhexidine Anesthesia: the lesion was anesthetized in a standard fashion   Anesthetic:  1% lidocaine w/ epinephrine 1-100,000 local infiltration Instrument used: flexible razor blade   Outcome: patient tolerated procedure well   Post-procedure details: wound care instructions given    Specimen 1 - Surgical pathology Differential Diagnosis: scc vs bcc wart Check Margins: No  Dermatitis (2) Left Submandibular Area; Right Postauricular Area  Ordered Medications: betamethasone dipropionate 0.05 % cream

## 2020-01-10 ENCOUNTER — Other Ambulatory Visit: Payer: Self-pay | Admitting: Family Medicine

## 2020-01-18 ENCOUNTER — Other Ambulatory Visit: Payer: Self-pay | Admitting: Neurology

## 2020-02-02 ENCOUNTER — Ambulatory Visit: Payer: Medicare Other | Admitting: Physician Assistant

## 2020-03-14 ENCOUNTER — Telehealth: Payer: Self-pay | Admitting: Neurology

## 2020-03-14 NOTE — Telephone Encounter (Signed)
Patient sees Dr. Sima Matas on 12/6 for formal memory testing. I would like to have that prior to appointment. Please cancel and reschedule her appointment with me for early February as Dr. Sima Matas has to meet with her after the testing and that usually takes a few weeks as well. thanks
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
## 2020-03-15 ENCOUNTER — Ambulatory Visit: Payer: Medicare Other | Admitting: Neurology

## 2020-03-20 ENCOUNTER — Other Ambulatory Visit: Payer: Self-pay

## 2020-03-20 ENCOUNTER — Encounter: Payer: Medicare Other | Attending: Psychology | Admitting: Psychology

## 2020-03-20 DIAGNOSIS — R4189 Other symptoms and signs involving cognitive functions and awareness: Secondary | ICD-10-CM | POA: Insufficient documentation

## 2020-03-20 DIAGNOSIS — R413 Other amnesia: Secondary | ICD-10-CM | POA: Insufficient documentation

## 2020-03-20 DIAGNOSIS — R4689 Other symptoms and signs involving appearance and behavior: Secondary | ICD-10-CM | POA: Diagnosis present

## 2020-03-21 ENCOUNTER — Other Ambulatory Visit: Payer: Self-pay | Admitting: Family Medicine

## 2020-03-21 DIAGNOSIS — E7800 Pure hypercholesterolemia, unspecified: Secondary | ICD-10-CM

## 2020-03-22 NOTE — Telephone Encounter (Signed)
Please call patient. Last visit was 02/2019; needs OV and lab work. Refilled #[id number]. No further refills w/o OV and lab work. Please schedule

## 2020-03-23 NOTE — Telephone Encounter (Signed)
LVM asking to call back.  

## 2020-03-28 ENCOUNTER — Other Ambulatory Visit: Payer: Self-pay | Admitting: Family Medicine

## 2020-04-06 ENCOUNTER — Telehealth: Payer: Self-pay | Admitting: Neurology

## 2020-04-06 ENCOUNTER — Encounter: Payer: Self-pay | Admitting: Psychology

## 2020-04-06 NOTE — Telephone Encounter (Signed)
Would one you mind calling patient and rescheduling her follow up with me to March? Looks like she has memory testing in early February and then they meet with Dr. Sima Matas at the end of February to review. They should have that done prior to seeing me. The first 2 weeks of March anytime would be fine thanks
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
## 2020-04-10 ENCOUNTER — Telehealth: Payer: Self-pay

## 2020-04-10 ENCOUNTER — Telehealth: Payer: Self-pay | Admitting: Neurology

## 2020-04-10 ENCOUNTER — Other Ambulatory Visit: Payer: Self-pay

## 2020-04-10 DIAGNOSIS — E7800 Pure hypercholesterolemia, unspecified: Secondary | ICD-10-CM

## 2020-04-10 MED ORDER — EZETIMIBE 10 MG PO TABS: ORAL_TABLET | ORAL | 0 refills | Status: DC

## 2020-04-10 MED ORDER — ROSUVASTATIN CALCIUM 5 MG PO TABS: 5.0000 mg | ORAL_TABLET | Freq: Every day | ORAL | 0 refills | Status: DC

## 2020-04-10 NOTE — Telephone Encounter (Signed)
90 day supply of both medications sent in

## 2020-04-10 NOTE — Telephone Encounter (Signed)
LVM for daughter, [name], to let her know of appt change because pt needs to see Dr. Jaynee Eagles after memory testing with Dr. Jefm Miles in February.

## 2020-04-10 NOTE — Telephone Encounter (Signed)
.   LAST APPOINTMENT DATE: Visit date not found   NEXT APPOINTMENT DATE:@Visit  date not found  MEDICATION:rosuvastatin (CRESTOR) 5 MG tablet   ezetimibe (ZETIA) 10 MG tablet   PHARMACY:[id number] - [place], [place] - [address] AT [address]  **Let patient know to contact pharmacy at the end of the day to make sure medication is ready. **  ** Please notify patient to allow 48-72 hours to process**  **Encourage patient to contact the pharmacy for refills or they can request refills through [facility]**  CLINICAL FILLS OUT ALL BELOW:   LAST REFILL:  QTY:  REFILL DATE:    OTHER COMMENTS:    Okay for refill?  Please advise

## 2020-04-16 ENCOUNTER — Other Ambulatory Visit: Payer: Self-pay | Admitting: Neurology

## 2020-04-28 ENCOUNTER — Encounter: Payer: Self-pay | Admitting: Psychology

## 2020-04-28 ENCOUNTER — Encounter: Payer: Medicare Other | Attending: Psychology | Admitting: Psychology

## 2020-04-28 ENCOUNTER — Other Ambulatory Visit: Payer: Self-pay

## 2020-04-28 DIAGNOSIS — G3109 Other frontotemporal dementia: Secondary | ICD-10-CM | POA: Diagnosis not present

## 2020-04-28 DIAGNOSIS — E039 Hypothyroidism, unspecified: Secondary | ICD-10-CM | POA: Insufficient documentation

## 2020-04-28 DIAGNOSIS — F0281 Dementia in other diseases classified elsewhere with behavioral disturbance: Secondary | ICD-10-CM | POA: Insufficient documentation

## 2020-04-28 DIAGNOSIS — E785 Hyperlipidemia, unspecified: Secondary | ICD-10-CM | POA: Diagnosis not present

## 2020-04-28 DIAGNOSIS — R4689 Other symptoms and signs involving appearance and behavior: Secondary | ICD-10-CM | POA: Diagnosis present

## 2020-04-28 DIAGNOSIS — I10 Essential (primary) hypertension: Secondary | ICD-10-CM | POA: Insufficient documentation

## 2020-04-28 DIAGNOSIS — G309 Alzheimer's disease, unspecified: Secondary | ICD-10-CM | POA: Diagnosis not present

## 2020-04-28 DIAGNOSIS — R413 Other amnesia: Secondary | ICD-10-CM | POA: Insufficient documentation

## 2020-04-28 DIAGNOSIS — F419 Anxiety disorder, unspecified: Secondary | ICD-10-CM | POA: Insufficient documentation

## 2020-04-28 DIAGNOSIS — R4189 Other symptoms and signs involving cognitive functions and awareness: Secondary | ICD-10-CM | POA: Insufficient documentation

## 2020-04-28 DIAGNOSIS — F32A Depression, unspecified: Secondary | ICD-10-CM | POA: Diagnosis not present

## 2020-04-28 DIAGNOSIS — Z820 Family history of epilepsy and other diseases of the nervous system: Secondary | ICD-10-CM | POA: Diagnosis not present

## 2020-04-28 NOTE — Progress Notes (Addendum)
Neuropsychology Note  [place] completed 240 minutes of neuropsychological testing with this provider. The patient did not appear overtly distressed by the testing session, per behavioral observation or via self-report. Rest breaks were offered. Orientated x 4. She was impulsive, inattentive, and lost instructional set on digits sequencing which significantly impacted her score. Instructions were repeated once discontinue criteria was met and limits were tested. She had trouble answering math word problems in time allowed and did not receive credit for 4 correct responses. When given an additional 10-15 seconds, performance significantly improved.     Clinical Decision Making: In considering the patient's current level of functioning, level of presumed impairment, nature of symptoms, emotional and behavioral responses during the interview, level of literacy, and observed level of motivation/effort, a battery of tests was selected. Changes were made as deemed necessary based on patient performance on testing, behavioral observations, and additional pertinent factors such as those listed above.  Tests Administered:   Animal Naming   [place] (BNT)   Clock Drawing  Controlled Oral Word Association (COWA)  Wechsler Adult Intelligence Scale, 4th Edition (WAIS-IV)   Wechsler Memory Scale, 4th Edition (WMS-IV); Older Adult Battery    Results:    TEST SCORES:  Note: This summary of test scores accompanies the interpretive report and should not be considered in isolation without reference to the appropriate sections in the text. Descriptors are based on appropriate normative data and may be adjusted based on clinical judgment. The terms "impaired" and "within normal limits (WNL)" are used when a more specific level of functioning cannot be determined.    Validity Testing:     Descriptor         WAIS-IV Reliable Digit Span: --- --- Within Expectation         Intellectual  Functioning:               Wechsler Adult Intelligence Scale (WAIS-IV):  Standard Score/ Scaled Score Percentile    Full Scale IQ  83 13 Below Average  GAI 87 19 Below Average  Verbal Comprehension Index: 89 23 Below Average  Similarities  8 25 Average  Vocabulary 8 25 Average  Information  8 25 Average  Perceptual Reasoning Index:  88 21 Below Average  Block Design  10 50 Average  Matrix Reasoning  7 16 Below Average  Visual Puzzles 7 16 Below Average  Working Memory Index: 74 4 Well Below Average  Digit Span 4 2 Well Below Average  Arithmetic  7 16 Below Average         (w/extended time) 10 50 Average  Processing Speed Index: 92 30 Average  Symbol Search  7 16 Below Average  Coding 10 50 Average         Attention/Executive Function:                 Scaled Score Percentile    WAIS-IV Coding: 10 50 Average           Scaled Score Percentile    WAIS-IV Digit Span: 4 2 Well Below Average  Forward 6 9 Below Average  Backward 8 25 Average  Sequencing 3 1 Exceptionally Low     (w/testing limits) 9 37 Average         Age-Scaled Score Percentile    Wechsler Memory Scale (WMS-IV) Symbol Span: 5 5 Well Below Average           Scaled Score Percentile    WAIS-IV Similarities: 8 25 Average  Language:               Verbal Fluency Test: Raw Score (T Score) Percentile    Phonemic Fluency (FAS) 52 (57) 75 Above Average  Animal Fluency 16 (42) 21 Below Average           Raw Score (T Score) Percentile    [place] Naming Test (BNT): 49/60 (41) 18 Below Average         Visuospatial/Visuoconstruction:          Raw Score Percentile    Clock Drawing: 10/10 --- Within Normal Limits           Scaled Score Percentile    WAIS-IV Block Design: 10 50 Average  WAIS-IV Matrix Reasoning: 7 16 Below Average  WAIS-IV Visual Puzzles: 7 16 Below Average        Memory:          WMS-IV: Older Adult Battery  Index Score Summary  Index Sum of Scaled Scores Index Score Percentile Rank  95% Confidence Interval Qualitative Descriptor  Auditory Memory (AMI) 31 87 19 81-94 Low Average  Visual Memory (VMI) 16 89 23 84-94 Low Average  Immediate Memory (IMI) 28 95 37 89-101 Average  Delayed Memory (DMI) 19 77 6 71-86 Borderline    Primary Subtest Scaled Score Summary  Subtest Domain Raw Score Scaled Score Percentile Rank  Logical Memory I AM 27 10 50  Logical Memory II AM $Rem'5 6 9  'UdmE$ Verbal Paired Associates I AM $Re'14 8 25  'dhl$ Verbal Paired Associates II AM $Rem'3 7 16  'HLMO$ Visual Reproduction I VM 25 10 50  Visual Reproduction II VM $Rem'4 6 9  'ptCv$ Symbol Span VWM $Remove'5 5 5    'yhqCcJX$ Auditory Memory Process Score Summary  Process Score Raw Score Scaled Score Percentile Rank Cumulative Percentage (Base Rate)  LM II Recognition 18 - - 51-75%  VPA II Recognition 24 - - 17-25%    Visual Memory Process Score Summary  Process Score Raw Score Scaled Score Percentile Rank Cumulative Percentage (Base Rate)  VR II Recognition 2 - - 17-25%    ABILITY-MEMORY ANALYSIS  Ability Score:  GAI: 87 Date of Testing:  WAIS-IV 2020/04/28; WMS-IV 2020/04/28  Predicted Difference Method   Index Predicted WMS-IV Index Score Actual WMS-IV Index Score Difference Critical Value  Significant Difference Y/N Base Rate  Auditory Memory 93 87 6 9.33 N   Visual Memory 92 89 3 7.72 N   Immediate Memory 91 95 -4 10.41 N   Delayed Memory 92 77 15 10.86 Y 10-15%  Statistical significance (critical value) at the .01 level.   Contrast Scaled Scores  Score Score 1 Score 2 Contrast Scaled Score  General Ability Index vs. Auditory Memory Index 87 87 8  General Ability Index vs. Visual Memory Index 87 89 9  General Ability Index vs. Immediate Memory Index 87 95 11  General Ability Index vs. Delayed Memory Index 87 77 6  Verbal Comprehension Index vs. Auditory Memory Index 89 87 9  Perceptual Reasoning Index vs. Visual Memory Index 88 89 10  Working Memory Index vs. Auditory Memory Index 74 87 10     *Note: Digits Span  Sequencing performance significantly improved from severely impaired (<1st percentile) to average range (37th percentile) on re-administration. She had trouble answering math word problems in time allowed and did not receive credit for 4 correct responses. When given an additional 10-15 seconds, performance on WAIS-IV Arithmetic subtest significantly improves from below average (16th percentile) to average (50th  percentile) range. Thus, her Working Memory Index score of 74 (4th percentile), which falls in the borderline impaired range, appears to underestimate her true average capability; WMI=92 (30th percentile) with changes to sequencing and arithmetic scores. Nevertheless, this showcases the variability in aspects of her performance.    Rebecca Hubbard will return for an interactive feedback session with Dr. Sima Matas on 06/08/20 at which time her test performances, clinical impressions and treatment recommendations will be reviewed in detail. The patient understands she can contact our office should she require our assistance before this time.   Full report to follow.

## 2020-05-09 ENCOUNTER — Other Ambulatory Visit: Payer: Self-pay | Admitting: Family Medicine

## 2020-05-09 ENCOUNTER — Telehealth: Payer: Self-pay | Admitting: Family Medicine

## 2020-05-09 NOTE — Telephone Encounter (Signed)
Left message for patient to call back and schedule Medicare Annual Wellness Visit (AWV) either virtually or in office.   Last AWV 03/21/20   please schedule at anytime   This should be a 45 minute visit.

## 2020-05-11 ENCOUNTER — Encounter: Payer: Medicare Other | Admitting: Psychology

## 2020-05-11 ENCOUNTER — Other Ambulatory Visit: Payer: Self-pay

## 2020-05-14 ENCOUNTER — Other Ambulatory Visit: Payer: Self-pay | Admitting: Family Medicine

## 2020-05-14 DIAGNOSIS — E034 Atrophy of thyroid (acquired): Secondary | ICD-10-CM

## 2020-05-15 ENCOUNTER — Other Ambulatory Visit: Payer: Self-pay

## 2020-05-15 ENCOUNTER — Encounter (HOSPITAL_BASED_OUTPATIENT_CLINIC_OR_DEPARTMENT_OTHER): Payer: Medicare Other | Admitting: Psychology

## 2020-05-15 DIAGNOSIS — G3109 Other frontotemporal dementia: Secondary | ICD-10-CM

## 2020-05-15 DIAGNOSIS — R4689 Other symptoms and signs involving appearance and behavior: Secondary | ICD-10-CM

## 2020-05-15 DIAGNOSIS — R413 Other amnesia: Secondary | ICD-10-CM | POA: Diagnosis not present

## 2020-05-15 DIAGNOSIS — R4189 Other symptoms and signs involving cognitive functions and awareness: Secondary | ICD-10-CM | POA: Diagnosis not present

## 2020-05-15 DIAGNOSIS — F0280 Dementia in other diseases classified elsewhere without behavioral disturbance: Secondary | ICD-10-CM | POA: Diagnosis not present

## 2020-05-17 ENCOUNTER — Ambulatory Visit: Payer: Medicare Other | Admitting: Neurology

## 2020-05-18 ENCOUNTER — Encounter: Payer: Medicare Other | Admitting: Psychology

## 2020-06-01 ENCOUNTER — Other Ambulatory Visit: Payer: Self-pay | Admitting: Family Medicine

## 2020-06-08 ENCOUNTER — Encounter: Payer: Medicare Other | Attending: Psychology | Admitting: Psychology

## 2020-06-08 ENCOUNTER — Other Ambulatory Visit: Payer: Self-pay

## 2020-06-08 DIAGNOSIS — R4189 Other symptoms and signs involving cognitive functions and awareness: Secondary | ICD-10-CM | POA: Diagnosis not present

## 2020-06-08 DIAGNOSIS — R413 Other amnesia: Secondary | ICD-10-CM | POA: Diagnosis not present

## 2020-06-08 DIAGNOSIS — G3109 Other frontotemporal dementia: Secondary | ICD-10-CM | POA: Diagnosis not present

## 2020-06-08 DIAGNOSIS — F0280 Dementia in other diseases classified elsewhere without behavioral disturbance: Secondary | ICD-10-CM

## 2020-06-08 DIAGNOSIS — R4689 Other symptoms and signs involving appearance and behavior: Secondary | ICD-10-CM
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
## 2020-06-15 ENCOUNTER — Encounter: Payer: Self-pay | Admitting: Family Medicine

## 2020-06-15 DIAGNOSIS — G3109 Other frontotemporal dementia: Secondary | ICD-10-CM | POA: Insufficient documentation

## 2020-06-15 DIAGNOSIS — F0280 Dementia in other diseases classified elsewhere without behavioral disturbance: Secondary | ICD-10-CM

## 2020-06-15 HISTORY — DX: Dementia in other diseases classified elsewhere, unspecified severity, without behavioral disturbance, psychotic disturbance, mood disturbance, and anxiety: F02.80

## 2020-06-23 ENCOUNTER — Encounter: Payer: Self-pay | Admitting: Psychology

## 2020-06-23 NOTE — Progress Notes (Signed)
Today I provided feedback regarding the results of the current neuropsychological evaluation.  We addressed the results of the current neuropsychological evaluation with the patient and her family as well as worked on treatment recommendations etc.  The complete neuropsychological evaluation can be found in its entirety in the patient's EMR dated 05/15/2020.  Below I will include the summary of results and interpretations for convenience.    Summary of Results:                        The results of the current neuropsychological evaluation show that global composite scores of overall functioning are currently in the below average range with the patient producing a full-scale IQ score of 83 falling at the 13th percentile and a general abilities index score of 87 which falls at the 19th percentile. Given the patient's occupational and educational history this is a significant drop. It is estimated that the patient has likely historically functioned in the high average range relative to a normative population in this performance suggest a likely function between 20-30 standard T score point drop. This indicates that several specific areas of cognitive functioning have had significant decreases in functioning and efficiency.  The patient was given a number of measures specific to expressive language functioning. While the patient did very well on phonetic fluency more targeted fluency measures and cueing of words performed in the below average range suggesting some difficulties with language and word finding abilities. Other measures within the verbal domains including the patient's verbal comprehension index score showed only mild deficits with regard to verbal reasoning and problem solving abilities, her general fund of information and vocabulary knowledge. While these are below predicted levels they are still at the lower end of the average range.  On measures of visual spatial and visual constructional  abilities, the patient did well on structured visual construction capacity and did well on her abilities her visual analysis and organization. However, on measures of visual reasoning and problem-solving the patient showed significant deficits in significantly below predicted levels.  On measures of attention and concentration the patient showed difficulty with shifting attention and maintaining set while pure encoding abilities improved when support was made in maintaining understanding of instructional set was accomplished. The patient did well on arithmetic measures when there was extended time given suggesting core mathematical abilities remain intact while under the time limitations the patient showed significant deficits. The patient did relatively well on measures of focus execute abilities and overall information processing speed and mental operations.  The patient was administered a structured and well normed memory battery (Wechsler Memory Scale-IV for older adult). While the patient's encoding abilities are significantly impaired rating attentional deficits the patient's overall immediate memory performed in the average range. However, delayed memory functions was severely impaired and in fact was one of the areas that she showed the greatest deficits and across measures. Visual encoding appears to be more impaired than auditory encoding but both are having significant deficits. Breaking the patient's memory down between auditory versus visual memory there was little difference between these 2 components with the patient producing an auditory memory index score of 87 which falls at the 19th percentile and in the low average and the patient is visual memory index score produced a visual memory index of 89 which falls at the 23rd percentile and also in the low average range. This does not suggest any lateralization to deficits in both her delayed memory for visual and  delayed memory for auditory  functioning were significantly impaired. The patient did show some improvement with recognition and cueing components but there were still clear indications of memory deficits even and cued memory formats. When looking at current cognitive functioning relative to global intellectual abilities there was not a great difference on most of the measures with her global cognitive deficits consistent with memory deficits with the exception of a significant difference between predicted levels on delayed memory index suggesting significant impairments relative to already decreased global cognitive functions for delayed memory and learning.  Impression/Diagnosis:                     Overall, the results of the current neuropsychological evaluation are consistent between objective measures of current neuropsychological and cognitive functioning, subjective reports of memory deficits, executive functioning deficits including reasoning and problem-solving deficits both verbal and visually as well as attentional deficits with difficulties in shifting attention and maintaining focus on instructions. This produces significant encoding impairments both visually and auditorily and while initial learning of information was fairly well maintained this information is not maintained over a period of delay clearly creating deficits with regard to new learning. These cognitive deficits are consistent with subjective reports by the patient and family. Objective cognitive deficits were found mainly in domains connected to frontal and temporal brain functioning. The patient's mathematical abilities and visual constructional and visual analysis abilities performed well with other measures pertaining to parietal lobe functioning and subcortical functioning generally intact. These findings are very consistent with MRI and PET scan studies as well suggesting the primary neurological and neuropsychological deficits are related to changes in  frontal and temporal regions and not particularly significant changes in parietal or subcortical regions with the exception of likely hippocampal changes.  As far as diagnostic considerations the current subjective symptoms, objective medical data and imaging data, and objective neuropsychological testing performance are all consistent with a frontotemporal dementia with some potential consistencies with Alzheimer's-like global pattern but clear focus on frontal temporal brain regions. There does not appear to be significant vascular involvement and there is no indication of significant white matter breakdown with either imaging studies or neuropsychological test data.  Given that this most likely diagnostic consideration would be consistent with a degenerative process plans and strategy should be developed by the family to address the potential progressive nature of this condition. Given the fact that this is a late onset presentation the progression may not be particularly rapid but they are very likely be ongoing progression with particular worsening of executive functioning, memory functions around new learning and some concerns about changes in personality and behavioral patterns.  I will go over the results of the current neuropsychological evaluation with the patient and her family and we will work on specific recommendations going forward. We will address the likely progressive nature of this condition as well as planning for how to maintain care for the patient has memory and executive functioning's will likely continue to progressively worsen.  Diagnosis:                               Frontotemporal dementia ([place])  Memory loss  Cognitive and behavioral changes   _____________________ Ilean Skill, Psy.D. Clinical Neuropsychologist
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
## 2020-06-28 ENCOUNTER — Encounter: Payer: Self-pay | Admitting: Neurology

## 2020-06-28 ENCOUNTER — Ambulatory Visit (INDEPENDENT_AMBULATORY_CARE_PROVIDER_SITE_OTHER): Payer: Medicare Other | Admitting: Neurology

## 2020-06-28 ENCOUNTER — Other Ambulatory Visit: Payer: Self-pay | Admitting: Family Medicine

## 2020-06-28 VITALS — BP 150/78 | HR 66 | Ht 60.0 in | Wt 193.0 lb

## 2020-06-28 DIAGNOSIS — F0280 Dementia in other diseases classified elsewhere without behavioral disturbance: Secondary | ICD-10-CM | POA: Diagnosis not present

## 2020-06-28 DIAGNOSIS — G3109 Other frontotemporal dementia: Secondary | ICD-10-CM

## 2020-06-28 MED ORDER — MECLIZINE HCL 12.5 MG PO TABS: 12.5000 mg | ORAL_TABLET | Freq: Three times a day (TID) | ORAL | 6 refills | Status: DC | PRN

## 2020-06-28 NOTE — Patient Instructions (Signed)
Meclizine tablets or capsules What is this medicine? MECLIZINE (MEK li zeen) is an antihistamine. It is used to prevent nausea, vomiting, or dizziness caused by motion sickness. It is also used to prevent and treat vertigo (extreme dizziness or a feeling that you or your surroundings are tilting or spinning around). This medicine may be used for other purposes; ask your health care provider or pharmacist if you have questions. COMMON BRAND NAME(S): Antivert, Dramamine Less Drowsy, Dramamine-N, Medivert, Meni-D What should I tell my health care provider before I take this medicine? They need to know if you have any of these conditions:  glaucoma  lung or breathing disease, like asthma  problems urinating  prostate disease  stomach or intestine problems  an unusual or allergic reaction to meclizine, other medicines, foods, dyes, or preservatives  pregnant or trying to get pregnant  breast-feeding How should I use this medicine? Take this medicine by mouth with a glass of water. Follow the directions on the prescription label. If you are using this medicine to prevent motion sickness, take the dose at least 1 hour before travel. If it upsets your stomach, take it with food or milk. Take your doses at regular intervals. Do not take your medicine more often than directed. Talk to your pediatrician regarding the use of this medicine in children. Special care may be needed. Overdosage: If you think you have taken too much of this medicine contact a poison control center or emergency room at once. NOTE: This medicine is only for you. Do not share this medicine with others. What if I miss a dose? If you miss a dose, take it as soon as you can. If it is almost time for your next dose, take only that dose. Do not take double or extra doses. What may interact with this medicine? Do not take this medicine with any of the following medications:  MAOIs like Carbex, Eldepryl, Marplan, Nardil,  and Parnate This medicine may also interact with the following medications:  alcohol  antihistamines for allergy, cough and cold  certain medicines for anxiety or sleep  certain medicines for depression, like amitriptyline, fluoxetine, sertraline  certain medicines for seizures like phenobarbital, primidone  general anesthetics like halothane, isoflurane, methoxyflurane, propofol  local anesthetics like lidocaine, pramoxine, tetracaine  medicines that relax muscles for surgery  narcotic medicines for pain  phenothiazines like chlorpromazine, mesoridazine, prochlorperazine, thioridazine This list may not describe all possible interactions. Give your health care provider a list of all the medicines, herbs, non-prescription drugs, or dietary supplements you use. Also tell them if you smoke, drink alcohol, or use illegal drugs. Some items may interact with your medicine. What should I watch for while using this medicine? Tell your doctor or healthcare professional if your symptoms do not start to get better or if they get worse. You may get drowsy or dizzy. Do not drive, use machinery, or do anything that needs mental alertness until you know how this medicine affects you. Do not stand or sit up quickly, especially if you are an older patient. This reduces the risk of dizzy or fainting spells. Alcohol may interfere with the effect of this medicine. Avoid alcoholic drinks. Your mouth may get dry. Chewing sugarless gum or sucking hard candy, and drinking plenty of water may help. Contact your doctor if the problem does not go away or is severe. This medicine may cause dry eyes and blurred vision. If you wear contact lenses you may feel some discomfort. Lubricating drops  may help. See your eye doctor if the problem does not go away or is severe. What side effects may I notice from receiving this medicine? Side effects that you should report to your doctor or health care professional as soon as  possible:  feeling faint or lightheaded, falls  fast, irregular heartbeat Side effects that usually do not require medical attention (report to your doctor or health care professional if they continue or are bothersome):  constipation  headache  trouble passing urine or change in the amount of urine  trouble sleeping  upset stomach This list may not describe all possible side effects. Call your doctor for medical advice about side effects. You may report side effects to FDA at 1-800-FDA-1088. Where should I keep my medicine? Keep out of the reach of children. Store at room temperature between 15 and 30 degrees C (59 and 86 degrees F). Keep container tightly closed. Throw away any unused medicine after the expiration date. NOTE: This sheet is a summary. It may not cover all possible information. If you have questions about this medicine, talk to your doctor, pharmacist, or health care provider.  2021 Elsevier/Gold Standard (2015-05-03 19:41:02)

## 2020-07-03 ENCOUNTER — Encounter: Payer: Medicare Other | Admitting: Family Medicine

## 2020-07-06 ENCOUNTER — Other Ambulatory Visit: Payer: Self-pay | Admitting: Family Medicine

## 2020-07-14 ENCOUNTER — Other Ambulatory Visit: Payer: Self-pay | Admitting: Neurology

## 2020-08-20 ENCOUNTER — Encounter: Payer: Self-pay | Admitting: Family Medicine

## 2020-08-23 ENCOUNTER — Encounter: Payer: Self-pay | Admitting: Family Medicine

## 2020-08-23 ENCOUNTER — Other Ambulatory Visit: Payer: Self-pay

## 2020-08-23 ENCOUNTER — Ambulatory Visit (INDEPENDENT_AMBULATORY_CARE_PROVIDER_SITE_OTHER): Payer: Medicare Other | Admitting: Family Medicine

## 2020-08-23 VITALS — BP 134/68 | HR 86 | Temp 98.0°F | Ht 60.0 in | Wt 191.2 lb

## 2020-08-23 DIAGNOSIS — I10 Essential (primary) hypertension: Secondary | ICD-10-CM | POA: Diagnosis not present

## 2020-08-23 DIAGNOSIS — F0280 Dementia in other diseases classified elsewhere without behavioral disturbance: Secondary | ICD-10-CM | POA: Diagnosis not present

## 2020-08-23 DIAGNOSIS — G3109 Other frontotemporal dementia: Secondary | ICD-10-CM

## 2020-08-23 DIAGNOSIS — F339 Major depressive disorder, recurrent, unspecified: Secondary | ICD-10-CM | POA: Diagnosis not present

## 2020-08-23 MED ORDER — MIRTAZAPINE 30 MG PO TABS: 30.0000 mg | ORAL_TABLET | Freq: Every day | ORAL | 1 refills | Status: DC

## 2020-08-23 NOTE — Patient Instructions (Signed)
Please return in 3 months for complete physical and recheck mood.  I've increased the dose of the mirtazapine to 30mg  every night to see if we can help the mood. Adjusting to the driving limitations is also playing a role in her mood.   If you have any questions or concerns, please don't hesitate to send me a message via MyChart or call the office at [phone]. Thank you for visiting with [place] today! It's our pleasure caring for you.

## 2020-08-23 NOTE — Progress Notes (Signed)
Subjective  CC:  Chief Complaint  Patient presents with  . Neurology    Following up from Dr.Aherns visit in march.  . Hypertension    HPI: Rebecca Hubbard is a 80 y.o. female who presents to the office today to address the problems listed above in the chief complaint.  80 year old female recently diagnosed with frontotemporal lobe dementia.  I reviewed extensive notes from neurology, neuropsychology, brain MRI reports.  She is here with her daughter.  Overall she is doing fairly well.  Her daughter does report some times of confusion.  Patient realizes that her memory is not like it used to be but she expects this.  She has little insight to her diagnosis as expected.  They are struggling due to clear recommendations that it is unsafe for her to be driving.  Patient feels like she is losing her independence and is very frustrated by that.  She also suffer from depression.  We started Remeron back in November.  It may be slightly helpful, currently helping her sleep.  However she does complain about feeling down.  Her history is unclear regarding her mood currently.  She is very frustrated.  Hypertension: She takes triamterene 37.5/25 daily.  She feels well.  No chest pain or shortness of breath.  No lower extremity edema.  No adverse effects from medications.   Assessment  1. Frontotemporal dementia ([place])   2. Essential hypertension   3. Depression, recurrent ([place])      Plan   FTD: Continue to try to educate regarding her diagnosis and prognosis.  Fortunately, overall she continues to thrive.  We discussed safety concerns regarding driving.  The recommendation that she not drive.  Her family seems adamantly committed to this.  Follow-up with neurology.  She is on memory medications.  Hypertension: This medical condition is well controlled. There are no signs of complications, medication side effects, or red flags. Patient is instructed to continue the current treatment plan  without change in therapies or medications.,  Continue triamterene daily.  Recurrent depression: Mildly active I believe.  Will increase Remeron from 15 mg daily to 30 mg daily.   Follow up: 3 months for complete physical and recheck   No orders of the defined types were placed in this encounter.  Meds ordered this encounter  Medications  . mirtazapine (REMERON) 30 MG tablet    Sig: Take 1 tablet (30 mg total) by mouth at bedtime.    Dispense:  90 tablet    Refill:  1      I reviewed the patients updated PMH, FH, and SocHx.    Patient Active Problem List   Diagnosis Date Noted  . Depression, recurrent ([place]) 05/25/2018    Priority: High  . Mixed hyperlipidemia 03/08/2017    Priority: High  . Essential hypertension 10/12/2010    Priority: High  . Hypothyroidism due to acquired atrophy of thyroid 10/12/2010    Priority: High  . Aortic stenosis, mild 09/10/2017    Priority: Medium  . Chronic tophaceous gout 07/08/2017    Priority: Medium  . LPRD (laryngopharyngeal reflux disease) 04/18/2015    Priority: Medium  . Moderate persistent asthma 04/18/2015    Priority: Medium  . Colon adenoma 11/03/2013    Priority: Medium  . Osteoarthritis 10/12/2010    Priority: Medium  . Urge incontinence of urine 07/08/2017    Priority: Low  . Other allergic rhinitis 04/18/2015    Priority: Low  . Frontotemporal dementia ([place]) 06/15/2020  .  Memory loss 12/07/2019  . Glaucoma 05/25/2018  . Statin intolerance 05/25/2018  . Subarachnoid hemorrhage following injury ([place]) 01/29/2014   Current Meds  Medication Sig  . albuterol (VENTOLIN HFA) 108 (90 Base) MCG/ACT inhaler Inhale 2 puffs into the lungs every 6 (six) hours as needed for wheezing or shortness of breath.   . Bepotastine Besilate 1.5 % SOLN Place 1 drop into both eyes daily as needed. 1.5 %  . betamethasone dipropionate 0.05 % cream Apply topically 2 (two) times daily as needed (Rash).  . calcium carbonate (OS-CAL) 600 MG TABS  tablet Take 600 mg by mouth daily.  . DULERA 200-5 MCG/ACT AERO USE 2 PUFFS WITH SPACER EVERY 12 HOURS TO PREVENT COUGH OR WHEEZE, RINSE, GARGLE AND SPIT AFTER USE  . ezetimibe (ZETIA) 10 MG tablet TAKE 1 TABLET BY MOUTH EVERY DAY  . fexofenadine (ALLEGRA) 180 MG tablet Take 180 mg by mouth daily.  Marland Kitchen GELNIQUE 10 % GEL Apply 1 application topically daily as needed (going to the bathroom as often).   . meclizine (ANTIVERT) 12.5 MG tablet Take 1 tablet (12.5 mg total) by mouth 3 (three) times daily as needed for dizziness.  . meloxicam (MOBIC) 15 MG tablet Take 1 tablet by mouth daily as needed.  . memantine (NAMENDA) 10 MG tablet TAKE 1 TABLET BY MOUTH TWICE A DAY  . naproxen sodium (ALEVE) 220 MG tablet Take 220 mg by mouth daily as needed (pain).  . polyethylene glycol (MIRALAX / GLYCOLAX) 17 g packet Take 17 g by mouth daily.  . rosuvastatin (CRESTOR) 5 MG tablet Take 1 tablet (5 mg total) by mouth at bedtime.  Marland Kitchen SYNTHROID 112 MCG tablet Take 1 tablet (112 mcg total) by mouth daily.  Marland Kitchen triamterene-hydrochlorothiazide (MAXZIDE-25) 37.5-25 MG tablet TAKE 1 TABLET BY MOUTH EVERY DAY  . [DISCONTINUED] mirtazapine (REMERON) 15 MG tablet TAKE 1 TABLET BY MOUTH EVERYDAY AT BEDTIME    Allergies: Patient is allergic to penicillins, sulfa antibiotics, aspirin, and atorvastatin. Family History: Patient family history includes Alzheimer's disease in her father; Cancer in her sister; Stroke in her mother. Social History:  Patient  reports that she quit smoking about 40 years ago. Her smoking use included cigarettes. She has never used smokeless tobacco. She reports current alcohol use of about 4.0 - 5.0 standard drinks of alcohol per week. She reports that she does not use drugs.  Review of Systems: Constitutional: Negative for fever malaise or anorexia Cardiovascular: negative for chest pain Respiratory: negative for SOB or persistent cough Gastrointestinal: negative for abdominal pain  Objective   Vitals: BP 134/68   Pulse 86   Temp 98 F (36.7 C) (Temporal)   Ht 5' (1.524 m)   Wt 191 lb 3.2 oz (86.7 kg)   SpO2 98%   BMI 37.34 kg/m  General: no acute distress , A&Ox3 Psych: Speech is clear, mood is frustrated Cardiovascular:  RRR without murmur or gallop.  Respiratory:  Good breath sounds bilaterally, CTAB with normal respiratory effort    Commons side effects, risks, benefits, and alternatives for medications and treatment plan prescribed today were discussed, and the patient expressed understanding of the given instructions. Patient is instructed to call or message via MyChart if he/she has any questions or concerns regarding our treatment plan. No barriers to understanding were identified. We discussed Red Flag symptoms and signs in detail. Patient expressed understanding regarding what to do in case of urgent or emergency type symptoms.   Medication list was reconciled, printed and provided to  the patient in AVS. Patient instructions and summary information was reviewed with the patient as documented in the AVS. This note was prepared with assistance of Dragon voice recognition software. Occasional wrong-word or sound-a-like substitutions may have occurred due to the inherent limitations of voice recognition software  This visit occurred during the SARS-CoV-2 public health emergency.  Safety protocols were in place, including screening questions prior to the visit, additional usage of staff PPE, and extensive cleaning of exam room while observing appropriate contact time as indicated for disinfecting solutions.

## 2020-09-18 ENCOUNTER — Ambulatory Visit (INDEPENDENT_AMBULATORY_CARE_PROVIDER_SITE_OTHER): Payer: Medicare Other | Admitting: Family Medicine

## 2020-09-18 ENCOUNTER — Encounter: Payer: Self-pay | Admitting: Family Medicine

## 2020-09-18 ENCOUNTER — Other Ambulatory Visit: Payer: Self-pay

## 2020-09-18 VITALS — BP 138/80 | HR 84 | Temp 98.0°F | Resp 16 | Ht 60.0 in | Wt 188.4 lb

## 2020-09-18 DIAGNOSIS — E034 Atrophy of thyroid (acquired): Secondary | ICD-10-CM | POA: Diagnosis not present

## 2020-09-18 DIAGNOSIS — E782 Mixed hyperlipidemia: Secondary | ICD-10-CM

## 2020-09-18 DIAGNOSIS — F0280 Dementia in other diseases classified elsewhere without behavioral disturbance: Secondary | ICD-10-CM

## 2020-09-18 DIAGNOSIS — L304 Erythema intertrigo: Secondary | ICD-10-CM

## 2020-09-18 DIAGNOSIS — I10 Essential (primary) hypertension: Secondary | ICD-10-CM

## 2020-09-18 DIAGNOSIS — F339 Major depressive disorder, recurrent, unspecified: Secondary | ICD-10-CM | POA: Diagnosis not present

## 2020-09-18 DIAGNOSIS — G3109 Other frontotemporal dementia: Secondary | ICD-10-CM

## 2020-09-18 MED ORDER — CLOTRIMAZOLE-BETAMETHASONE 1-0.05 % EX CREA: 1.0000 "application " | TOPICAL_CREAM | Freq: Every day | CUTANEOUS | 0 refills | Status: AC

## 2020-09-18 NOTE — Progress Notes (Addendum)
Subjective  Chief Complaint  Patient presents with  . Annual Exam    Oatmeal for breakfast, recently lost her license - feeling depressed, "I've lost my independence"   . Hypertension  . Hyperlipidemia  . Rash    Right side of abdomen, itchy   . Health Maintenance    Wanting to get Prevnar 13 today     HPI: Rebecca Hubbard is a 80 y.o. female who presents to [facility] today for a Female Wellness Visit. She also has the concerns and/or needs as listed above in the chief complaint. These will be addressed in addition to the Health Maintenance Visit.   Wellness Visit: annual visit with health maintenance review and exam without Pap   HM: screens are up to date. imms up to date. Declines dexa screens Chronic disease f/u and/or acute problem visit: (deemed necessary to be done in addition to the wellness visit):  FTD and depression. See last note. We increased remeron to 30 and she is feeling better. Still struggling with not driving but understands reasons why. Daughters are helping her to problem solve around it. [place] concerns but misses it.   Right abd skin fold: flaking red itching rash x months.   Chronic problems: thyroid, htn, hld all seem fine to her. Taking her meds w/o AEs. No cp or sob. Energy level is good. Tolerating crestor now.   Assessment  1. Essential hypertension   2. Depression, recurrent ([place])   3. Hypothyroidism due to acquired atrophy of thyroid   4. Mixed hyperlipidemia   5. Frontotemporal dementia ([place])   6. Intertrigo      Plan  Female Wellness Visit:  Age appropriate Health Maintenance and Prevention measures were discussed with patient. Included topics are cancer screening recommendations, ways to keep healthy (see AVS) including dietary and exercise recommendations, regular eye and dental care, use of seat belts, and avoidance of moderate alcohol use and tobacco use.    BMI: discussed patient's BMI and encouraged positive lifestyle modifications to help get to or maintain a target BMI.  HM needs and immunizations were addressed and ordered. See below for orders. See HM and immunization section for updates. utd  Routine labs and screening tests ordered including cmp, cbc and lipids where appropriate.  Discussed recommendations regarding Vit D and calcium supplementation (see AVS)  Chronic disease management visit and/or acute problem visit:  FTD: agree with no driving; rec uber etc. Counseling done  Depression: improving. Recheck 3 months. Continue remeron. Get back into swimming as this is a stress reliever for her and good for her health   Htn: stable   HLD recheck on statin and zetia. Goal LDL <70  Recheck thyroid. Clinically euthyroid.   Intertrigo: discussed tx and prevention. lotrisone ordered.   Follow up: 3 months   Orders Placed This Encounter  Procedures  . CBC with Differential/Platelet  . Comprehensive metabolic panel  . Lipid panel  . TSH   Meds ordered this encounter  Medications  . clotrimazole-betamethasone (LOTRISONE) cream    Sig: Apply 1 application topically daily for 14 days.    Dispense:  45 g    Refill:  0      Body mass index is 36.79 kg/m. Wt Readings from Last 3 Encounters:  09/18/20 188 lb 6.4 oz (85.5 kg)  08/23/20 191 lb 3.2 oz (86.7 kg)  06/28/20 193 lb (87.5 kg)     Patient  Active Problem List   Diagnosis Date Noted  . Frontotemporal dementia ([place]) 06/15/2020    Priority: High    neurocog testing 05/2019   . Depression, recurrent ([place]) 05/25/2018    Priority: High    Sept  - January: since divorcing her husband many years ago.    . Mixed hyperlipidemia 03/08/2017    Priority: High  . Essential hypertension 10/12/2010    Priority: High  . Hypothyroidism due to acquired atrophy of thyroid 10/12/2010    Priority: High  . Glaucoma 05/25/2018    Priority: Medium  . Aortic stenosis, mild  09/10/2017    Priority: Medium    Echo 08/2017:  2.6 m/s peak vel  26 mmHg peak grad 16 mmHg mean grad 1.0 cm2.  Normal systolic function.   . Chronic tophaceous gout 07/08/2017    Priority: Medium  . LPRD (laryngopharyngeal reflux disease) 04/18/2015    Priority: Medium  . Moderate persistent asthma 04/18/2015    Priority: Medium  . Colon adenoma 11/03/2013    Priority: Medium  . Osteoarthritis 10/12/2010    Priority: Medium  . Urge incontinence of urine 07/08/2017    Priority: Low  . Other allergic rhinitis 04/18/2015    Priority: Low  . Memory loss 12/07/2019  . Statin intolerance 05/25/2018    crestor and lipitor   . Subarachnoid hemorrhage following injury ([place]) 01/29/2014   Health Maintenance  Topic Date Due  . COVID-19 Vaccine (4 - Booster for Moderna series) 10/04/2020 (Originally 06/19/2020)  . TETANUS/TDAP  09/18/2021 (Originally 01/27/1960)  . INFLUENZA VACCINE  11/13/2020  . PNA vac Low Risk Adult  Completed  . Zoster Vaccines- Shingrix  Completed  . HPV VACCINES  Aged Out  . Pneumococcal Vaccine 51-81 Years old  Discontinued  . DEXA SCAN  Discontinued   Immunization History  Administered Date(s) Administered  . Influenza Split 03/26/2012  . Influenza, High Dose Seasonal PF 03/15/2014, 12/29/2014, 02/05/2016, 02/25/2017, 01/22/2018, 01/22/2019, 01/22/2019  . Moderna SARS-COV2 Booster Vaccination 03/21/2020  . Moderna Sars-Covid-2 Vaccination 04/27/2019, 05/25/2019  . Pneumococcal Conjugate-13 01/27/2014  . Pneumococcal Polysaccharide-23 03/23/2012, 03/26/2012  . Zoster Recombinat (Shingrix) 09/24/2018, 01/25/2020   We updated and reviewed the patient's past history in detail and it is documented below. Allergies: Patient is allergic to penicillins, sulfa antibiotics, aspirin, and atorvastatin. Past Medical History Patient  has a past medical history of Arthritis, Asthma, Depression, recurrent ([place]) (05/25/2018), Frontotemporal dementia ([place]) (06/15/2020), GERD  (gastroesophageal reflux disease), Glaucoma, Hyperlipidemia, Hypertension, Hypothyroidism, Seasonal allergies, and Vertigo. Past Surgical History Patient  has a past surgical history that includes Thyroidectomy; Abdominal hysterectomy; and Multiple tooth extractions. Family History: Patient family history includes Alzheimer's disease in her father; Cancer in her sister; Stroke in her mother. Social History:  Patient  reports that she quit smoking about 40 years ago. Her smoking use included cigarettes. She has never used smokeless tobacco. She reports current alcohol use of about 4.0 - 5.0 standard drinks of alcohol per week. She reports that she does not use drugs.  Review of Systems: Constitutional: negative for fever or malaise Ophthalmic: negative for photophobia, double vision or loss of vision Cardiovascular: negative for chest pain, dyspnea on exertion, or new LE swelling Respiratory: negative for SOB or persistent cough Gastrointestinal: negative for abdominal pain, change in bowel habits or melena Genitourinary: negative for dysuria or gross hematuria, no abnormal uterine bleeding or disharge Musculoskeletal: negative for new gait disturbance or muscular weakness Integumentary: negative for new or persistent rashes, no breast lumps Neurological: negative  for TIA or stroke symptoms Psychiatric: negative for SI or delusions Allergic/Immunologic: negative for hives  Patient Care Team    Relationship Specialty Notifications Start End  Leamon Arnt, MD PCP - General Family Medicine  05/25/18   Calvert Cantor, MD Consulting Physician Ophthalmology  05/27/18   Silvestre Gunner, Anderson [place], PA-C (Inactive)  Dermatology  12/30/19     Objective  Vitals: BP 138/80   Pulse 84   Temp 98 F (36.7 C) (Temporal)   Resp 16   Ht 5' (1.524 m)   Wt 188 lb 6.4 oz (85.5 kg)   SpO2 98%   BMI 36.79 kg/m  General:  Well developed, well nourished, no acute distress  Psych:  Alert and  orientedx3,normal mood and affect HEENT:  Normocephalic, atraumatic, non-icteric sclera,  supple neck without adenopathy, mass or thyromegaly Cardiovascular:  Normal S1, S2, RRR with harsh systolic murmur Respiratory:  Good breath sounds bilaterally, CTAB with normal respiratory effort Gastrointestinal: normal bowel sounds, soft, non-tender, no noted masses. No HSM MSK: no deformities, contusions. Joints are without erythema or swelling.  Skin:  Warm, right abdominal skin fold with about 4 inch elipitcal flaking, hyperpigmented rash with abrasions.  Neurologic:    Mental status is normal. CN 2-11 are normal. Gross motor and sensory exams are normal. Normal gait. No tremor   Commons side effects, risks, benefits, and alternatives for medications and treatment plan prescribed today were discussed, and the patient expressed understanding of the given instructions. Patient is instructed to call or message via MyChart if he/she has any questions or concerns regarding our treatment plan. No barriers to understanding were identified. We discussed Red Flag symptoms and signs in detail. Patient expressed understanding regarding what to do in case of urgent or emergency type symptoms.   Medication list was reconciled, printed and provided to the patient in AVS. Patient instructions and summary information was reviewed with the patient as documented in the AVS. This note was prepared with assistance of Dragon voice recognition software. Occasional wrong-word or sound-a-like substitutions may have occurred due to the inherent limitations of voice recognition software  This visit occurred during the SARS-CoV-2 public health emergency.  Safety protocols were in place, including screening questions prior to the visit, additional usage of staff PPE, and extensive cleaning of exam room while observing appropriate contact time as indicated for disinfecting solutions.

## 2020-09-18 NOTE — Patient Instructions (Signed)
Please return in 3 months to recheck mood.   I will release your lab results to you on your MyChart account with further instructions. Please reply with any questions.   If you have any questions or concerns, please don't hesitate to send me a message via MyChart or call the office at [phone]. Thank you for visiting with [place] today! It's our pleasure caring for you.

## 2020-09-19 LAB — CBC WITH DIFFERENTIAL/PLATELET
Basophils Absolute: 0 10*3/uL (ref 0.0–0.1)
Basophils Relative: 0.8 % (ref 0.0–3.0)
Eosinophils Absolute: 0.1 10*3/uL (ref 0.0–0.7)
Eosinophils Relative: 2.5 % (ref 0.0–5.0)
HCT: 38.8 % (ref 36.0–46.0)
Hemoglobin: 13.7 g/dL (ref 12.0–15.0)
Lymphocytes Relative: 28.6 % (ref 12.0–46.0)
Lymphs Abs: 1.5 10*3/uL (ref 0.7–4.0)
MCHC: 35.2 g/dL (ref 30.0–36.0)
MCV: 89.2 fl (ref 78.0–100.0)
Monocytes Absolute: 0.5 10*3/uL (ref 0.1–1.0)
Monocytes Relative: 10.1 % (ref 3.0–12.0)
Neutro Abs: 3.1 10*3/uL (ref 1.4–7.7)
Neutrophils Relative %: 58 % (ref 43.0–77.0)
Platelets: 224 10*3/uL (ref 150.0–400.0)
RBC: 4.36 Mil/uL (ref 3.87–5.11)
RDW: 14.3 % (ref 11.5–15.5)
WBC: 5.3 10*3/uL (ref 4.0–10.5)

## 2020-09-19 LAB — COMPREHENSIVE METABOLIC PANEL
ALT: 11 U/L (ref 0–35)
AST: 14 U/L (ref 0–37)
Albumin: 3.8 g/dL (ref 3.5–5.2)
Alkaline Phosphatase: 110 U/L (ref 39–117)
BUN: 10 mg/dL (ref 6–23)
CO2: 22 mEq/L (ref 19–32)
Calcium: 8.8 mg/dL (ref 8.4–10.5)
Chloride: 97 mEq/L (ref 96–112)
Creatinine, Ser: 0.92 mg/dL (ref 0.40–1.20)
GFR: 58.34 mL/min — ABNORMAL LOW (ref >60.00)
Glucose, Bld: 88 mg/dL (ref 70–99)
Potassium: 3.6 mEq/L (ref 3.5–5.1)
Sodium: 136 mEq/L (ref 135–145)
Total Bilirubin: 0.8 mg/dL (ref 0.2–1.2)
Total Protein: 7.1 g/dL (ref 6.0–8.3)

## 2020-09-19 LAB — TSH: TSH: 0.19 u[IU]/mL — ABNORMAL LOW (ref 0.35–4.50)

## 2020-09-19 LAB — LIPID PANEL
Cholesterol: 178 mg/dL (ref 0–200)
HDL: 67.6 mg/dL (ref >39.00)
LDL Cholesterol: 91 mg/dL (ref 0–99)
NonHDL: 110.44
Total CHOL/HDL Ratio: 3
Triglycerides: 95 mg/dL (ref 0.0–149.0)
VLDL: 19 mg/dL (ref 0.0–40.0)

## 2020-09-20 ENCOUNTER — Other Ambulatory Visit: Payer: Self-pay

## 2020-09-20 DIAGNOSIS — E034 Atrophy of thyroid (acquired): Secondary | ICD-10-CM

## 2020-09-20 MED ORDER — LEVOTHYROXINE SODIUM 100 MCG PO TABS: 100.0000 ug | ORAL_TABLET | Freq: Every day | ORAL | 3 refills | Status: DC

## 2020-09-20 NOTE — Progress Notes (Signed)
Please call patient: I have reviewed his/her lab results. Her thyroid medication needs to be lowered to 153mcg daily. Please order the new RX for 90 days. Please schedule nurse visit in 8 weeks for TSH, dx Hypothyroidism. Other labs look good.

## 2020-09-25 ENCOUNTER — Other Ambulatory Visit: Payer: Self-pay | Admitting: Family Medicine

## 2020-10-01 ENCOUNTER — Other Ambulatory Visit: Payer: Self-pay | Admitting: Family Medicine

## 2020-11-06 ENCOUNTER — Other Ambulatory Visit: Payer: Self-pay | Admitting: Family Medicine

## 2020-11-13 ENCOUNTER — Other Ambulatory Visit (INDEPENDENT_AMBULATORY_CARE_PROVIDER_SITE_OTHER): Payer: Medicare Other

## 2020-11-13 DIAGNOSIS — E034 Atrophy of thyroid (acquired): Secondary | ICD-10-CM | POA: Diagnosis not present

## 2020-11-14 LAB — TSH: TSH: 0.39 u[IU]/mL (ref 0.35–5.50)

## 2020-11-16 NOTE — Progress Notes (Signed)
Please call patient: I have reviewed his/her lab results. Her thyroid level is now good. Continue current dose of levothyroxine. thanks

## 2020-11-22 ENCOUNTER — Other Ambulatory Visit: Payer: Self-pay | Admitting: Family Medicine

## 2020-11-22 DIAGNOSIS — E7800 Pure hypercholesterolemia, unspecified: Secondary | ICD-10-CM

## 2020-12-25 ENCOUNTER — Ambulatory Visit: Payer: Commercial Managed Care - PPO | Admitting: Family Medicine

## 2020-12-26 ENCOUNTER — Ambulatory Visit (INDEPENDENT_AMBULATORY_CARE_PROVIDER_SITE_OTHER): Payer: Medicare Other | Admitting: Family Medicine

## 2020-12-26 ENCOUNTER — Other Ambulatory Visit: Payer: Self-pay

## 2020-12-26 ENCOUNTER — Encounter: Payer: Self-pay | Admitting: Family Medicine

## 2020-12-26 VITALS — BP 118/80 | HR 78 | Temp 97.6°F | Ht 60.0 in | Wt 188.4 lb

## 2020-12-26 DIAGNOSIS — G244 Idiopathic orofacial dystonia: Secondary | ICD-10-CM

## 2020-12-26 DIAGNOSIS — K130 Diseases of lips: Secondary | ICD-10-CM

## 2020-12-26 DIAGNOSIS — F339 Major depressive disorder, recurrent, unspecified: Secondary | ICD-10-CM | POA: Diagnosis not present

## 2020-12-26 DIAGNOSIS — G3109 Other frontotemporal dementia: Secondary | ICD-10-CM

## 2020-12-26 DIAGNOSIS — M159 Polyosteoarthritis, unspecified: Secondary | ICD-10-CM

## 2020-12-26 DIAGNOSIS — E034 Atrophy of thyroid (acquired): Secondary | ICD-10-CM

## 2020-12-26 DIAGNOSIS — Z23 Encounter for immunization: Secondary | ICD-10-CM

## 2020-12-26 DIAGNOSIS — F0280 Dementia in other diseases classified elsewhere without behavioral disturbance: Secondary | ICD-10-CM

## 2020-12-26 DIAGNOSIS — M8949 Other hypertrophic osteoarthropathy, multiple sites: Secondary | ICD-10-CM

## 2020-12-26 MED ORDER — KETOCONAZOLE 2 % EX CREA: 1.0000 "application " | TOPICAL_CREAM | Freq: Two times a day (BID) | CUTANEOUS | 0 refills | Status: DC

## 2020-12-26 NOTE — Progress Notes (Signed)
Subjective  CC:  Chief Complaint  Patient presents with   Depression    HPI: Rebecca Hubbard is a 80 y.o. female who presents to the office today to address the problems listed above in the chief complaint, mood problems. F/u depression: doing much better overall. Onremeraon 30 daily. Sleeping well. Attitude is better. Working to fully accepting her driving restrictions. Getting out more with the help of her family.  Depression screen [facility] 2/9 12/26/2020 09/18/2020 03/22/2019  Decreased Interest 0 1 1  Down, Depressed, Hopeless 0 1 0  PHQ - 2 Score 0 2 1  Altered sleeping 0 0 1  Tired, decreased energy 0 0 0  Change in appetite 0 0 0  Feeling bad or failure about yourself  0 0 0  Trouble concentrating 0 3 0  Moving slowly or fidgety/restless 0 0 0  Suicidal thoughts 0 0 0  PHQ-9 Score 0 5 2  Difficult doing work/chores Not difficult at all Somewhat difficult -   Has soreness and fissuring on the corners of her mouth. Started around the time of new dentures. We also started oxybutinin around that time. Endorses a dry mouth. No pain. Using neosporin w/o improvement. During encounter, noted repetitive involuntary tongue movements/lip licking. This is new.  Low thyroid: reviewed recent tsh after dose adjustment down. Feels well.  Lab Results  Component Value Date   TSH 0.39 11/13/2020   HM: flu shot OA knees and unsteady gait; daughter is concerned because she wont' use her cane. Pt reports only needs it when her knees hurt. Wants to maintain her independence.  Wt Readings from Last 3 Encounters:  12/26/20 188 lb 6.4 oz (85.5 kg)  09/18/20 188 lb 6.4 oz (85.5 kg)  08/23/20 191 lb 3.2 oz (86.7 kg)    Assessment  1. Depression, recurrent ([place])   2. Frontotemporal dementia ([place])   3. Hypothyroidism due to acquired atrophy of thyroid   4. Orofacial dyskinesia   5. Cheilitis   6. Need for immunization against influenza      Plan  Depression:  much improved. Continue  remeron. TCA - possibly related to orofacial dyskinesias. May need to be adjusted. Pt and daughter prefer to keep it the same for now and adjust other things. See below Orofacial dyskinesia: ? Drug induced, due to dry mouth and/or dentures. Will hold oxybutinin, check with dentist who fitted dentures and treat chelitis. Daughter will monitor facial movements and f/u with me. She is not on an antipsychotic. Hypothyroidism is now controlled.  FTD: stable. On namenda. Flu shot today  Follow up: 3 months for recheck  Orders Placed This Encounter  Procedures   Flu Vaccine QUAD High Dose(Fluad)   Meds ordered this encounter  Medications   ketoconazole (NIZORAL) 2 % cream    Sig: Apply 1 application topically 2 (two) times daily for 7 days. Then as needed    Dispense:  30 g    Refill:  0      I reviewed the patients updated PMH, FH, and SocHx.    Patient Active Problem List   Diagnosis Date Noted   Frontotemporal dementia ([place]) 06/15/2020    Priority: High   Depression, recurrent ([place]) 05/25/2018    Priority: High   Mixed hyperlipidemia 03/08/2017    Priority: High   Essential hypertension 10/12/2010    Priority: High   Hypothyroidism due to acquired atrophy of thyroid 10/12/2010    Priority: High   Glaucoma 05/25/2018    Priority: Medium  Aortic stenosis, mild 09/10/2017    Priority: Medium   Chronic tophaceous gout 07/08/2017    Priority: Medium   LPRD (laryngopharyngeal reflux disease) 04/18/2015    Priority: Medium   Moderate persistent asthma 04/18/2015    Priority: Medium   Colon adenoma 11/03/2013    Priority: Medium   Osteoarthritis 10/12/2010    Priority: Medium   Urge incontinence of urine 07/08/2017    Priority: Low   Other allergic rhinitis 04/18/2015    Priority: Low   Memory loss 12/07/2019   Statin intolerance 05/25/2018   Current Meds  Medication Sig   albuterol (VENTOLIN HFA) 108 (90 Base) MCG/ACT inhaler Inhale 2 puffs into the lungs every 6 (six)  hours as needed for wheezing or shortness of breath.    Bepotastine Besilate 1.5 % SOLN Place 1 drop into both eyes daily as needed. 1.5 %   betamethasone dipropionate 0.05 % cream Apply topically 2 (two) times daily as needed (Rash).   calcium carbonate (OS-CAL) 600 MG TABS tablet Take 600 mg by mouth daily.   DULERA 200-5 MCG/ACT AERO USE 2 PUFFS WITH SPACER EVERY 12 HOURS TO PREVENT COUGH OR WHEEZE, RINSE, GARGLE AND SPIT AFTER USE   ezetimibe (ZETIA) 10 MG tablet TAKE 1 TABLET BY MOUTH EVERY DAY   fexofenadine (ALLEGRA) 180 MG tablet Take 180 mg by mouth daily.   ketoconazole (NIZORAL) 2 % cream Apply 1 application topically 2 (two) times daily for 7 days. Then as needed   latanoprost (XALATAN) 0.005 % ophthalmic solution Place 1 drop into both eyes at bedtime.   levothyroxine (SYNTHROID) 100 MCG tablet Take 1 tablet (100 mcg total) by mouth daily.   meclizine (ANTIVERT) 12.5 MG tablet Take 1 tablet (12.5 mg total) by mouth 3 (three) times daily as needed for dizziness.   meloxicam (MOBIC) 15 MG tablet Take 1 tablet by mouth daily as needed.   memantine (NAMENDA) 10 MG tablet TAKE 1 TABLET BY MOUTH TWICE A DAY   mirtazapine (REMERON) 30 MG tablet Take 1 tablet (30 mg total) by mouth at bedtime.   naproxen sodium (ALEVE) 220 MG tablet Take 220 mg by mouth daily as needed (pain).   oxybutynin (DITROPAN) 5 MG tablet TAKE 1 TABLET BY MOUTH TWICE A DAY   polyethylene glycol (MIRALAX / GLYCOLAX) 17 g packet Take 17 g by mouth daily.   rosuvastatin (CRESTOR) 5 MG tablet TAKE 1 TABLET BY MOUTH EVERYDAY AT BEDTIME   triamterene-hydrochlorothiazide (MAXZIDE-25) 37.5-25 MG tablet TAKE 1 TABLET BY MOUTH EVERY DAY    Allergies: Patient is allergic to penicillins, sulfa antibiotics, aspirin, and atorvastatin. Family history:  Patient family history includes Alzheimer's disease in her father; Cancer in her sister; Stroke in her mother. Social History   Socioeconomic History   Marital status: Widowed     Spouse name: Not on file   Number of children: 2   Years of education: 47 - undergrad plus 2 yrs of masters classes    Highest education level: Not on file  Occupational History   Occupation: retired    Fish farm manager: OTHER    Comment: social Naval architect   Tobacco Use   Smoking status: Former    Types: Cigarettes    Quit date: 1982    Years since quitting: 40.7   Smokeless tobacco: Never  Vaping Use   Vaping Use: Never used  Substance and Sexual Activity   Alcohol use: Yes    Alcohol/week: 4.0 - 5.0 standard drinks    Types: 4 -  5 Standard drinks or equivalent per week    Comment: seltzers and margaritas   Drug use: No   Sexual activity: Not Currently  Other Topics Concern   Not on file  Social History Narrative   Patient lives at home with daughter.   Caffeine Use: 1 cup daily   Social Determinants of Health   Financial Resource Strain: Not on file  Food Insecurity: Not on file  Transportation Needs: Not on file  Physical Activity: Not on file  Stress: Not on file  Social Connections: Not on file     Review of Systems: Constitutional: Negative for fever malaise or anorexia Cardiovascular: negative for chest pain Respiratory: negative for SOB or persistent cough Gastrointestinal: negative for abdominal pain  Objective  Vitals: BP 118/80   Pulse 78   Temp 97.6 F (36.4 C) (Temporal)   Ht 5' (1.524 m)   Wt 188 lb 6.4 oz (85.5 kg)   SpO2 95%   BMI 36.79 kg/m  General: no acute distress, well appearing, no apparent distress, well groomed Psych:  Alert and oriented x 3,normal mood, behavior, speech, dress, and thought processes.  Cardiovascular:  RRR without murmur or gallop. no peripheral edema HEENT: repetitive lip licking, corners of mouth with fissures and whiteness, dry mucous membranes Respiratory:  Good breath sounds bilaterally, CTAB with normal respiratory effort Skin:  Warm, no rashes   Commons side effects, risks, benefits, and alternatives for  medications and treatment plan prescribed today were discussed, and the patient expressed understanding of the given instructions. Patient is instructed to call or message via MyChart if he/she has any questions or concerns regarding our treatment plan. No barriers to understanding were identified. We discussed Red Flag symptoms and signs in detail. Patient expressed understanding regarding what to do in case of urgent or emergency type symptoms.  Medication list was reconciled, printed and provided to the patient in AVS. Patient instructions and summary information was reviewed with the patient as documented in the AVS. This note was prepared with assistance of Dragon voice recognition software. Occasional wrong-word or sound-a-like substitutions may have occurred due to the inherent limitations of voice recognition software

## 2020-12-26 NOTE — Patient Instructions (Signed)
Please return in 3 months for recheck.   Please stop the oxybutinin to see if this helps the dry mouth and the tongue movements.  The dentures could also be contributing.   We can try a different type of bladder medication if the oxybutinin is causing these symptoms.  If you have any questions or concerns, please don't hesitate to send me a message via MyChart or call the office at [phone]. Thank you for visiting with [place] today! It's our pleasure caring for you.

## 2021-01-03 ENCOUNTER — Encounter: Payer: Self-pay | Admitting: Neurology

## 2021-01-03 ENCOUNTER — Ambulatory Visit (INDEPENDENT_AMBULATORY_CARE_PROVIDER_SITE_OTHER): Payer: Medicare Other | Admitting: Neurology

## 2021-01-03 VITALS — BP 136/67 | HR 60 | Ht 60.0 in | Wt 190.0 lb

## 2021-01-03 DIAGNOSIS — F0280 Dementia in other diseases classified elsewhere without behavioral disturbance: Secondary | ICD-10-CM

## 2021-01-03 DIAGNOSIS — G3109 Other frontotemporal dementia: Secondary | ICD-10-CM | POA: Diagnosis not present

## 2021-01-03 MED ORDER — DONEPEZIL HCL 5 MG PO TABS: 5.0000 mg | ORAL_TABLET | Freq: Every day | ORAL | 11 refills | Status: DC

## 2021-01-03 NOTE — Patient Instructions (Addendum)
Continue Memantine Start Donepezil - in a few months mychart me and we can increase it to 10mg  a day Follow up 6 months "[facility]" lisa moscani [facility] diet out of [employer]  Donepezil Oral Dissolving Tablet What is this medication? DONEPEZIL (doe NEP e zil) is used to treat mild to moderate dementia caused by Alzheimer's disease. This medicine may be used for other purposes; ask your health care provider or pharmacist if you have questions. COMMON BRAND NAME(S): Aricept What should I tell my care team before I take this medication? They need to know if you have any of these conditions: asthma or other lung disease difficulty passing urine head injury heart disease history of irregular heartbeat liver disease seizures (convulsions) stomach or intestinal disease, ulcers or stomach bleeding an unusual or allergic reaction to donepezil, other medicines, foods, dyes, or preservatives pregnant or trying to get pregnant breast-feeding How should I use this medication? Take this medicine by mouth. Follow the directions on the prescription label. Place the tablet in the mouth and allow it to dissolve, then swallow. While you may take these tablets with water, it is not necessary to do so. You may take this medicine with or without food. Take your doses at regular intervals. This medicine is usually taken before bedtime. Do not take your medicine more often than directed. Continue to take your medicine even if you feel better. Do not stop taking except on the advice of your doctor or health care professional. Talk to your pediatrician regarding the use of this medicine in children. Special care may be needed. Overdosage: If you think you have taken too much of this medicine contact a poison control center or emergency room at once. NOTE: This medicine is only for you. Do not share this medicine with others. What if I miss a dose? If you miss a dose, take it as soon as you can. If it is almost  time for your next dose, take only that dose. Do not take double or extra doses. What may interact with this medication? Do not take this medicine with any of the following medications: certain medicines for fungal infections like itraconazole, fluconazole, posaconazole, and voriconazole cisapride dextromethorphan; quinidine dronedarone pimozide quinidine thioridazine This medicine may also interact with the following medications: antihistamines for allergy, cough and cold atropine bethanechol carbamazepine certain medicines for bladder problems like oxybutynin, tolterodine certain medicines for Parkinson's disease like benztropine, trihexyphenidyl certain medicines for stomach problems like dicyclomine, hyoscyamine certain medicines for travel sickness like scopolamine dexamethasone dofetilide ipratropium NSAIDs, medicines for pain and inflammation, like ibuprofen or naproxen other medicines for Alzheimer's disease other medicines that prolong the QT interval (cause an abnormal heart rhythm) phenobarbital phenytoin rifampin, rifabutin or rifapentine ziprasidone This list may not describe all possible interactions. Give your health care provider a list of all the medicines, herbs, non-prescription drugs, or dietary supplements you use. Also tell them if you smoke, drink alcohol, or use illegal drugs. Some items may interact with your medicine. What should I watch for while using this medication? Visit your doctor or health care professional for regular checks on your progress. Check with your doctor or health care professional if your symptoms do not get better or if they get worse. You may get drowsy or dizzy. Do not drive, use machinery, or do anything that needs mental alertness until you know how this drug affects you. What side effects may I notice from receiving this medication? Side effects that you should report to  your doctor or health care professional as soon as  possible: allergic reactions like skin rash, itching or hives, swelling of the face, lips, or tongue feeling faint or lightheaded, falls loss of bladder control seizures signs and symptoms of a dangerous change in heartbeat or heart rhythm like chest pain; dizziness; fast or irregular heartbeat; palpitations; feeling faint or lightheaded, falls; breathing problems signs and symptoms of infection like fever or chills; cough; sore throat; pain or trouble passing urine signs and symptoms of liver injury like dark yellow or brown urine; general ill feeling or flu-like symptoms; light-colored stools; loss of appetite; nausea; right upper belly pain; unusually weak or tired; yellowing of the eyes or skin slow heartbeat or palpitations unusual bleeding or bruising vomiting Side effects that usually do not require medical attention (report to your doctor or health care professional if they continue or are bothersome): diarrhea, especially when starting treatment headache loss of appetite muscle cramps nausea stomach upset This list may not describe all possible side effects. Call your doctor for medical advice about side effects. You may report side effects to FDA at 1-800-FDA-1088. Where should I keep my medication? Keep out of reach of children. Store at room temperature between 15 and 30 degrees C (59 and 86 degrees F). Throw away any unused medicine after the expiration date. NOTE: This sheet is a summary. It may not cover all possible information. If you have questions about this medicine, talk to your doctor, pharmacist, or health care provider.  2022 Elsevier/Gold Standard (2018-03-23 10:24:00)
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
## 2021-01-05 ENCOUNTER — Other Ambulatory Visit: Payer: Self-pay | Admitting: Family Medicine

## 2021-02-16 ENCOUNTER — Other Ambulatory Visit: Payer: Self-pay | Admitting: Family Medicine

## 2021-02-16 ENCOUNTER — Other Ambulatory Visit: Payer: Self-pay | Admitting: Neurology

## 2021-02-17 ENCOUNTER — Other Ambulatory Visit: Payer: Self-pay | Admitting: Family Medicine

## 2021-02-17 DIAGNOSIS — E7800 Pure hypercholesterolemia, unspecified: Secondary | ICD-10-CM

## 2021-03-18 ENCOUNTER — Encounter: Payer: Self-pay | Admitting: Neurology

## 2021-03-19 ENCOUNTER — Other Ambulatory Visit: Payer: Self-pay | Admitting: Neurology

## 2021-03-19 MED ORDER — DONEPEZIL HCL 10 MG PO TABS: 10.0000 mg | ORAL_TABLET | Freq: Every day | ORAL | 11 refills | Status: DC

## 2021-03-20 ENCOUNTER — Other Ambulatory Visit: Payer: Self-pay | Admitting: Family Medicine

## 2021-03-27 ENCOUNTER — Ambulatory Visit: Payer: Commercial Managed Care - PPO | Admitting: Family Medicine

## 2021-04-04 ENCOUNTER — Encounter: Payer: Self-pay | Admitting: Neurology

## 2021-04-10 NOTE — Telephone Encounter (Signed)
Called daughter of pt who is Marine scientist.  She states that she does not feel that her mom (who lives with her) has an UTI, it is more that she has anxiety that can cause her behavior changes.  I relayed Dr. Cathren Laine message and she verbalized udnerstanding.  I was able to move up appt to 05-14-2021 as a mychart VV.  She was appreciative of that.  She will call back as needed prior to that.  Will be in touch with pcp as needed.

## 2021-04-19 ENCOUNTER — Ambulatory Visit (INDEPENDENT_AMBULATORY_CARE_PROVIDER_SITE_OTHER): Payer: Medicare Other | Admitting: Adult Health

## 2021-04-19 ENCOUNTER — Encounter: Payer: Self-pay | Admitting: Adult Health

## 2021-04-19 ENCOUNTER — Other Ambulatory Visit: Payer: Self-pay

## 2021-04-19 VITALS — BP 137/65 | HR 62 | Ht 60.0 in | Wt 195.4 lb

## 2021-04-19 DIAGNOSIS — F0280 Dementia in other diseases classified elsewhere without behavioral disturbance: Secondary | ICD-10-CM | POA: Diagnosis not present

## 2021-04-19 DIAGNOSIS — F32A Depression, unspecified: Secondary | ICD-10-CM | POA: Diagnosis not present

## 2021-04-19 DIAGNOSIS — G3109 Other frontotemporal dementia: Secondary | ICD-10-CM

## 2021-04-19 NOTE — Patient Instructions (Signed)
Your Plan:  Increase remeron 45 mg daily. Take 1 1/2 tablets at bedtime      Thank you for coming to see [place] at [facility]. I hope we have been able to provide you high quality care today.  You may receive a patient satisfaction survey over the next few weeks. We would appreciate your feedback and comments so that we may continue to improve ourselves and the health of our patients.
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
## 2021-05-02 ENCOUNTER — Telehealth: Payer: Self-pay | Admitting: Family Medicine

## 2021-05-02 NOTE — Telephone Encounter (Signed)
Spoke with patient's daughter to schedule AWV they declined at this time

## 2021-05-07 ENCOUNTER — Other Ambulatory Visit: Payer: Self-pay | Admitting: Family Medicine

## 2021-05-07 ENCOUNTER — Other Ambulatory Visit: Payer: Self-pay

## 2021-05-07 MED ORDER — TRIAMTERENE-HCTZ 37.5-25 MG PO TABS: 1.0000 | ORAL_TABLET | Freq: Every day | ORAL | 3 refills | Status: DC

## 2021-05-14 ENCOUNTER — Telehealth: Payer: Medicare Other | Admitting: Neurology

## 2021-05-16 ENCOUNTER — Other Ambulatory Visit: Payer: Self-pay | Admitting: Neurology

## 2021-05-17 ENCOUNTER — Other Ambulatory Visit: Payer: Self-pay | Admitting: Neurology

## 2021-05-18 ENCOUNTER — Other Ambulatory Visit: Payer: Self-pay | Admitting: Family Medicine

## 2021-05-18 DIAGNOSIS — E7800 Pure hypercholesterolemia, unspecified: Secondary | ICD-10-CM

## 2021-05-21 ENCOUNTER — Other Ambulatory Visit: Payer: Self-pay

## 2021-05-21 ENCOUNTER — Encounter: Payer: Self-pay | Admitting: Family Medicine

## 2021-05-21 ENCOUNTER — Ambulatory Visit (INDEPENDENT_AMBULATORY_CARE_PROVIDER_SITE_OTHER): Payer: Medicare Other | Admitting: Family Medicine

## 2021-05-21 VITALS — BP 139/58 | HR 73 | Temp 97.7°F | Wt 192.0 lb

## 2021-05-21 DIAGNOSIS — F0280 Dementia in other diseases classified elsewhere without behavioral disturbance: Secondary | ICD-10-CM | POA: Diagnosis not present

## 2021-05-21 DIAGNOSIS — F339 Major depressive disorder, recurrent, unspecified: Secondary | ICD-10-CM

## 2021-05-21 DIAGNOSIS — G3109 Other frontotemporal dementia: Secondary | ICD-10-CM

## 2021-05-21 DIAGNOSIS — K140 Glossitis: Secondary | ICD-10-CM | POA: Diagnosis not present

## 2021-05-21 DIAGNOSIS — I10 Essential (primary) hypertension: Secondary | ICD-10-CM | POA: Diagnosis not present

## 2021-05-21 DIAGNOSIS — G244 Idiopathic orofacial dystonia: Secondary | ICD-10-CM

## 2021-05-21 DIAGNOSIS — E034 Atrophy of thyroid (acquired): Secondary | ICD-10-CM | POA: Diagnosis not present

## 2021-05-21 MED ORDER — MIRTAZAPINE 30 MG PO TABS: 45.0000 mg | ORAL_TABLET | Freq: Every day | ORAL | 1 refills | Status: DC

## 2021-05-21 NOTE — Patient Instructions (Signed)
Please return in June for your annual complete physical; please come fasting.   I will release your lab results to you on your MyChart account with further instructions. Please reply with any questions.    Glad you are well overall.   If you have any questions or concerns, please don't hesitate to send me a message via MyChart or call the office at [phone]. Thank you for visiting with [place] today! It's our pleasure caring for you.

## 2021-05-21 NOTE — Progress Notes (Signed)
Subjective  CC:  Chief Complaint  Patient presents with   Depression    Been up and down    HPI: Rebecca Hubbard is a 81 y.o. female who presents to the office today to address the problems listed above in the chief complaint. Frontotemporal dementia: Reviewed recent neurology follow-up from January 5.  Continuing on Namenda and Aricept for dementia.  Mild worsening depressive symptoms and Remeron 30 mg daily was increased to 45 mg daily.  She feels that this has been helpful.  Daughter reports that overall she is doing well, although her mood will be up and down.  She seems to be cold often and likes to eat lollipops by the bag full. Still with some lip licking but daughter reports that it is much better since stopping the oxybutynin. Pt denies bothersome urgency sxs or polyuria at this time HTN: no cp, shortness of breath or lower extremity edema.  Takes Dyazide 37.5/25 daily.  No adverse effects.  Assessment  1. Frontotemporal dementia ([place])   2. Depression, recurrent ([place])   3. Hypothyroidism due to acquired atrophy of thyroid   4. Essential hypertension   5. Glossitis   6. Orofacial dyskinesia      Plan  FTD:  continue namenda and aricept. Progressing. Monitored by neurology Depression: improving on increased dose of remeron 45 daily. Continue. Day program at [facility] with memory care forms completed. No restrictions.  Check labs : thyroid and b12 Orofacial dyskinesia: improving.  Hypertension is well controlled continue Dyazide daily  Follow up: June for cpe  Visit date not found  Orders Placed This Encounter  Procedures   Vitamin F62   Basic metabolic panel   TSH   Meds ordered this encounter  Medications   mirtazapine (REMERON) 30 MG tablet    Sig: Take 1.5 tablets (45 mg total) by mouth at bedtime.    Dispense:  90 tablet    Refill:  1      I reviewed the patients updated PMH, FH, and SocHx.    Patient Active Problem List   Diagnosis Date  Noted   Frontotemporal dementia ([place]) 06/15/2020    Priority: High   Depression, recurrent ([place]) 05/25/2018    Priority: High   Mixed hyperlipidemia 03/08/2017    Priority: High   Essential hypertension 10/12/2010    Priority: High   Hypothyroidism due to acquired atrophy of thyroid 10/12/2010    Priority: High   Glaucoma 05/25/2018    Priority: Medium    Aortic stenosis, mild 09/10/2017    Priority: Medium    Chronic tophaceous gout 07/08/2017    Priority: Medium    LPRD (laryngopharyngeal reflux disease) 04/18/2015    Priority: Medium    Moderate persistent asthma 04/18/2015    Priority: Medium    Colon adenoma 11/03/2013    Priority: Medium    Osteoarthritis 10/12/2010    Priority: Medium    Urge incontinence of urine 07/08/2017    Priority: Low   Other allergic rhinitis 04/18/2015    Priority: Low   Memory loss 12/07/2019   Statin intolerance 05/25/2018   Current Meds  Medication Sig   albuterol (VENTOLIN HFA) 108 (90 Base) MCG/ACT inhaler Inhale 2 puffs into the lungs every 6 (six) hours as needed for wheezing or shortness of breath.    Bepotastine Besilate 1.5 % SOLN Place 1 drop into both eyes daily as needed. 1.5 %   betamethasone dipropionate 0.05 % cream Apply topically 2 (two) times  daily as needed (Rash).   calcium carbonate (OS-CAL) 600 MG TABS tablet Take 600 mg by mouth daily.   donepezil (ARICEPT) 10 MG tablet Take 1 tablet (10 mg total) by mouth at bedtime.   DULERA 200-5 MCG/ACT AERO USE 2 PUFFS WITH SPACER EVERY 12 HOURS TO PREVENT COUGH OR WHEEZE, RINSE, GARGLE AND SPIT AFTER USE   ezetimibe (ZETIA) 10 MG tablet TAKE 1 TABLET BY MOUTH EVERY DAY   fexofenadine (ALLEGRA) 180 MG tablet Take 180 mg by mouth as needed.   ketoconazole (NIZORAL) 2 % cream APPLY TO AFFECTED AREA(S) 2 TIMES A DAY AS NEEDED FOR 7 DAYS   latanoprost (XALATAN) 0.005 % ophthalmic solution Place 1 drop into both eyes at bedtime.   levothyroxine (SYNTHROID) 100 MCG tablet Take 1  tablet (100 mcg total) by mouth daily.   meclizine (ANTIVERT) 12.5 MG tablet TAKE 1 TABLET BY MOUTH 3 TIMES DAILY AS NEEDED FOR DIZZINESS.   memantine (NAMENDA) 10 MG tablet TAKE 1 TABLET BY MOUTH TWICE A DAY   naproxen sodium (ALEVE) 220 MG tablet Take 220 mg by mouth daily as needed (pain).   polyethylene glycol (MIRALAX / GLYCOLAX) 17 g packet Take 17 g by mouth as needed.   rosuvastatin (CRESTOR) 5 MG tablet TAKE 1 TABLET BY MOUTH EVERYDAY AT BEDTIME   triamterene-hydrochlorothiazide (MAXZIDE-25) 37.5-25 MG tablet TAKE 1 TABLET BY MOUTH EVERY DAY   [DISCONTINUED] mirtazapine (REMERON) 30 MG tablet TAKE 1 TABLET BY MOUTH AT BEDTIME.   [DISCONTINUED] oxybutynin (DITROPAN) 5 MG tablet TAKE 1 TABLET BY MOUTH TWICE A DAY   [DISCONTINUED] triamterene-hydrochlorothiazide (MAXZIDE-25) 37.5-25 MG tablet Take 1 tablet by mouth daily.    Allergies: Patient is allergic to penicillins, sulfa antibiotics, aspirin, and atorvastatin. Family History: Patient family history includes Alzheimer's disease in her father; Cancer in her sister; Stroke in her mother. Social History:  Patient  reports that she quit smoking about 41 years ago. Her smoking use included cigarettes. She has never used smokeless tobacco. She reports that she does not currently use alcohol after a past usage of about 5.0 standard drinks per week. She reports that she does not use drugs.  Review of Systems: Constitutional: Negative for fever malaise or anorexia Cardiovascular: negative for chest pain Respiratory: negative for SOB or persistent cough Gastrointestinal: negative for abdominal pain  Objective  Vitals: There were no vitals taken for this visit. General: no acute distress , A&Ox3 HEENT: [place]ERL, conjunctiva normal, neck is supple Cardiovascular:  RRR without murmur or gallop.  Respiratory:  Good breath sounds bilaterally, CTAB with normal respiratory effort Skin:  Warm, no rashes    Commons side effects, risks,  benefits, and alternatives for medications and treatment plan prescribed today were discussed, and the patient expressed understanding of the given instructions. Patient is instructed to call or message via MyChart if he/she has any questions or concerns regarding our treatment plan. No barriers to understanding were identified. We discussed Red Flag symptoms and signs in detail. Patient expressed understanding regarding what to do in case of urgent or emergency type symptoms.  Medication list was reconciled, printed and provided to the patient in AVS. Patient instructions and summary information was reviewed with the patient as documented in the AVS. This note was prepared with assistance of Dragon voice recognition software. Occasional wrong-word or sound-a-like substitutions may have occurred due to the inherent limitations of voice recognition software  This visit occurred during the SARS-CoV-2 public health emergency.  Safety protocols were in place, including screening  questions prior to the visit, additional usage of staff PPE, and extensive cleaning of exam room while observing appropriate contact time as indicated for disinfecting solutions.

## 2021-05-22 ENCOUNTER — Encounter: Payer: Self-pay | Admitting: Family Medicine

## 2021-05-22 ENCOUNTER — Other Ambulatory Visit: Payer: Self-pay

## 2021-05-22 LAB — BASIC METABOLIC PANEL
BUN: 15 mg/dL (ref 6–23)
CO2: 29 mEq/L (ref 19–32)
Calcium: 9.1 mg/dL (ref 8.4–10.5)
Chloride: 98 mEq/L (ref 96–112)
Creatinine, Ser: 0.83 mg/dL (ref 0.40–1.20)
GFR: 65.7 mL/min (ref >60.00)
Glucose, Bld: 67 mg/dL — ABNORMAL LOW (ref 70–99)
Potassium: 3.6 mEq/L (ref 3.5–5.1)
Sodium: 137 mEq/L (ref 135–145)

## 2021-05-22 LAB — TSH: TSH: 0.79 u[IU]/mL (ref 0.35–5.50)

## 2021-05-22 LAB — VITAMIN B12: Vitamin B-12: 251 pg/mL (ref 211–911)

## 2021-05-22 NOTE — Progress Notes (Signed)
I have put them in. No problem.

## 2021-05-27 NOTE — Progress Notes (Signed)
Please call patient: I have reviewed his/her lab results. Labs show that she has low vitamin B12 levels. Please have her start otc Vitamin B12 1000mg  daily. Othe rlabs are all stable

## 2021-05-29 NOTE — Progress Notes (Signed)
Spoke with pt, Gave verbalized understanding.

## 2021-06-16 ENCOUNTER — Other Ambulatory Visit: Payer: Self-pay | Admitting: Family Medicine

## 2021-07-01 ENCOUNTER — Encounter: Payer: Self-pay | Admitting: Neurology

## 2021-07-02 NOTE — Telephone Encounter (Signed)
I would discuss with Dr. Jonni Sanger.  ?

## 2021-07-04 ENCOUNTER — Ambulatory Visit: Payer: Medicare Other | Admitting: Adult Health

## 2021-07-10 ENCOUNTER — Ambulatory Visit (INDEPENDENT_AMBULATORY_CARE_PROVIDER_SITE_OTHER): Payer: Medicare Other | Admitting: Family Medicine

## 2021-07-10 ENCOUNTER — Encounter: Payer: Self-pay | Admitting: Family Medicine

## 2021-07-10 VITALS — BP 129/75 | HR 67 | Temp 97.9°F | Ht 60.0 in | Wt 192.4 lb

## 2021-07-10 DIAGNOSIS — G3109 Other frontotemporal dementia: Secondary | ICD-10-CM

## 2021-07-10 DIAGNOSIS — F339 Major depressive disorder, recurrent, unspecified: Secondary | ICD-10-CM | POA: Diagnosis not present

## 2021-07-10 DIAGNOSIS — F0280 Dementia in other diseases classified elsewhere without behavioral disturbance: Secondary | ICD-10-CM | POA: Diagnosis not present

## 2021-07-10 DIAGNOSIS — E538 Deficiency of other specified B group vitamins: Secondary | ICD-10-CM

## 2021-07-10 MED ORDER — ESCITALOPRAM OXALATE 10 MG PO TABS: 10.0000 mg | ORAL_TABLET | Freq: Every day | ORAL | 0 refills | Status: DC

## 2021-07-10 NOTE — Patient Instructions (Signed)
Please return in 3 months to recheck mood. ? ?Wean off of the remeron as follows: ?Take '30mg'$  daily for a week, ?Then take '15mg'$  daily for a week, ?Then take '15mg'$  every other day for a week, ?Then stop. ?Start the lexapro the 4th week.  ? ?If you have any questions or concerns, please don't hesitate to send me a message via MyChart or call the office at [phone]. Thank you for visiting with [place] today! It's our pleasure caring for you.  ?

## 2021-07-10 NOTE — Progress Notes (Signed)
? ?Subjective  ?CC:  ?Chief Complaint  ?Patient presents with  ? Depression  ?  Pt is accompanied by her daughter. States that she is just depressed.  ? ? ?HPI: Rebecca Hubbard is a 81 y.o. female who presents to the office today to address the problems listed above in the chief complaint, mood problems. ?81 year old with frontotemporal dementia and depression here with her daughter.  Her daughter reports that her mom states that she is depressed.  Daughter is concerned and thinks that maybe changing her medications would be better.  Her dementia is stable.  She continues to struggle with loss of autonomy due to not being able to drive anymore.  However, patient reports that overall she is stable.  She is on Remeron.  We use this to help with sleep.  She is up to 45 mg daily.  Patient does feel that it is helpful.  However after going back and forth patient agrees that switching medications may be beneficial.  No suicidality.  No adverse effects from Remeron. ? ?  07/10/2021  ? 12:20 PM 05/21/2021  ?  3:09 PM 12/26/2020  ? 11:37 AM  ?Depression screen PHQ 2/9  ?Decreased Interest 1  0  ?Down, Depressed, Hopeless 1 1 0  ?PHQ - 2 Score 2 1 0  ?Altered sleeping 0 0 0  ?Tired, decreased energy 0 0 0  ?Change in appetite 0 0 0  ?Feeling bad or failure about yourself  0 0 0  ?Trouble concentrating 0 0 0  ?Moving slowly or fidgety/restless 0 0 0  ?Suicidal thoughts 0 0 0  ?PHQ-9 Score 2 1 0  ?Difficult doing work/chores Somewhat difficult Not difficult at all Not difficult at all  ? ? ? ?Assessment  ?1. Depression, recurrent ([place])   ?2. Frontotemporal dementia ([place])   ?3. Vitamin B12 deficiency   ? ?  ?Plan  ?Depression: Currently active, partly reactive.  Partly related to her FTD.  Education counseling given.  Will wean from Remeron and changed to Lexapro.  Daughter uses Lexapro and has had a good response.  Follow-up 6 to 12 weeks for recheck ?Reviewed concept of mood problems caused by biochemical imbalance of  neurotransmitters and rationale for treatment with medications and therapy.  ?Counseling given: pt was instructed to contact office, on-call physician or crisis Hotline if symptoms worsen significantly. If patient develops any suicidal or homicidal thoughts, she is directed to the ER immediately.  ?I spent a total of 34 minutes for this patient encounter. Time spent included preparation, face-to-face counseling with the patient and coordination of care, review of chart and records, and documentation of the encounter. ? ?Follow up: 6 to 12 weeks for recheck ?No orders of the defined types were placed in this encounter. ? ?Meds ordered this encounter  ?Medications  ? escitalopram (LEXAPRO) 10 MG tablet  ?  Sig: Take 1 tablet (10 mg total) by mouth daily.  ?  Dispense:  90 tablet  ?  Refill:  0  ? ?  ? ?I reviewed the patients updated PMH, FH, and SocHx.  ?  ?Patient Active Problem List  ? Diagnosis Date Noted  ? Frontotemporal dementia ([place]) 06/15/2020  ?  Priority: High  ? Depression, recurrent ([place]) 05/25/2018  ?  Priority: High  ? Mixed hyperlipidemia 03/08/2017  ?  Priority: High  ? Essential hypertension 10/12/2010  ?  Priority: High  ? Hypothyroidism due to acquired atrophy of thyroid 10/12/2010  ?  Priority: High  ? Glaucoma  05/25/2018  ?  Priority: Medium   ? Aortic stenosis, mild 09/10/2017  ?  Priority: Medium   ? Chronic tophaceous gout 07/08/2017  ?  Priority: Medium   ? LPRD (laryngopharyngeal reflux disease) 04/18/2015  ?  Priority: Medium   ? Moderate persistent asthma 04/18/2015  ?  Priority: Medium   ? Colon adenoma 11/03/2013  ?  Priority: Medium   ? Osteoarthritis 10/12/2010  ?  Priority: Medium   ? Urge incontinence of urine 07/08/2017  ?  Priority: Low  ? Other allergic rhinitis 04/18/2015  ?  Priority: Low  ? Memory loss 12/07/2019  ? Statin intolerance 05/25/2018  ? ?Current Meds  ?Medication Sig  ? albuterol (VENTOLIN HFA) 108 (90 Base) MCG/ACT inhaler Inhale 2 puffs into the lungs every 6  (six) hours as needed for wheezing or shortness of breath.   ? Bepotastine Besilate 1.5 % SOLN Place 1 drop into both eyes daily as needed. 1.5 %  ? betamethasone dipropionate 0.05 % cream Apply topically 2 (two) times daily as needed (Rash).  ? calcium carbonate (OS-CAL) 600 MG TABS tablet Take 600 mg by mouth daily.  ? donepezil (ARICEPT) 10 MG tablet Take 1 tablet (10 mg total) by mouth at bedtime.  ? DULERA 200-5 MCG/ACT AERO USE 2 PUFFS WITH SPACER EVERY 12 HOURS TO PREVENT COUGH OR WHEEZE, RINSE, GARGLE AND SPIT AFTER USE  ? escitalopram (LEXAPRO) 10 MG tablet Take 1 tablet (10 mg total) by mouth daily.  ? ezetimibe (ZETIA) 10 MG tablet TAKE 1 TABLET BY MOUTH EVERY DAY  ? fexofenadine (ALLEGRA) 180 MG tablet Take 180 mg by mouth as needed.  ? ketoconazole (NIZORAL) 2 % cream APPLY TO AFFECTED AREA(S) 2 TIMES A DAY AS NEEDED FOR 7 DAYS  ? latanoprost (XALATAN) 0.005 % ophthalmic solution Place 1 drop into both eyes at bedtime.  ? levothyroxine (SYNTHROID) 100 MCG tablet Take 1 tablet (100 mcg total) by mouth daily.  ? meclizine (ANTIVERT) 12.5 MG tablet TAKE 1 TABLET BY MOUTH 3 TIMES DAILY AS NEEDED FOR DIZZINESS.  ? memantine (NAMENDA) 10 MG tablet TAKE 1 TABLET BY MOUTH TWICE A DAY  ? naproxen sodium (ALEVE) 220 MG tablet Take 220 mg by mouth daily as needed (pain).  ? polyethylene glycol (MIRALAX / GLYCOLAX) 17 g packet Take 17 g by mouth as needed.  ? rosuvastatin (CRESTOR) 5 MG tablet TAKE 1 TABLET BY MOUTH EVERYDAY AT BEDTIME  ? triamterene-hydrochlorothiazide (MAXZIDE-25) 37.5-25 MG tablet TAKE 1 TABLET BY MOUTH EVERY DAY  ? [DISCONTINUED] mirtazapine (REMERON) 30 MG tablet Take 1.5 tablets (45 mg total) by mouth at bedtime.  ? ? ?Allergies: ?Patient is allergic to penicillins, sulfa antibiotics, aspirin, and atorvastatin. ?Family history:  ?Patient family history includes Alzheimer's disease in her father; Cancer in her sister; Stroke in her mother. ?Social History  ? ?Socioeconomic History  ? Marital  status: Widowed  ?  Spouse name: Not on file  ? Number of children: 2  ? Years of education: 61 - undergrad plus 2 yrs of masters classes   ? Highest education level: Not on file  ?Occupational History  ? Occupation: retired  ?  Employer: OTHER  ?  Comment: Presenter, broadcasting   ?Tobacco Use  ? Smoking status: Former  ?  Types: Cigarettes  ?  Quit date: 61  ?  Years since quitting: 41.2  ? Smokeless tobacco: Never  ?Vaping Use  ? Vaping Use: Never used  ?Substance and Sexual Activity  ? Alcohol  use: Not Currently  ?  Alcohol/week: 5.0 standard drinks  ?  Types: 5 Cans of beer per week  ?  Comment: seltzers and margaritas  ? Drug use: No  ? Sexual activity: Not Currently  ?Other Topics Concern  ? Not on file  ?Social History Narrative  ? Patient lives at home with daughter.  ? Caffeine Use: 1-2 cups daily  ? ?Social Determinants of Health  ? ?Financial Resource Strain: Not on file  ?Food Insecurity: Not on file  ?Transportation Needs: Not on file  ?Physical Activity: Not on file  ?Stress: Not on file  ?Social Connections: Not on file  ? ? ? ?Review of Systems: ?Constitutional: Negative for fever malaise or anorexia ?Cardiovascular: negative for chest pain ?Respiratory: negative for SOB or persistent cough ?Gastrointestinal: negative for abdominal pain ? ?Objective  ?Vitals: BP 129/75   Pulse 67   Temp 97.9 ?F (36.6 ?C) (Temporal)   Ht 5' (1.524 m)   Wt 192 lb 6.4 oz (87.3 kg)   SpO2 98%   BMI 37.58 kg/m?  ?General: no acute distress, well appearing, no apparent distress, well groomed ?Psych:  Alert and oriented x 3,normal mood, behavior, speech, dress, and thought processes.  ? ? ? ?Commons side effects, risks, benefits, and alternatives for medications and treatment plan prescribed today were discussed, and the patient expressed understanding of the given instructions. Patient is instructed to call or message via MyChart if he/she has any questions or concerns regarding our treatment plan. No barriers to  understanding were identified. We discussed Red Flag symptoms and signs in detail. Patient expressed understanding regarding what to do in case of urgent or emergency type symptoms.  ?Medication list was reconciled,

## 2021-08-12 ENCOUNTER — Other Ambulatory Visit: Payer: Self-pay | Admitting: Family Medicine

## 2021-08-13 ENCOUNTER — Other Ambulatory Visit: Payer: Self-pay | Admitting: Family Medicine

## 2021-08-13 DIAGNOSIS — E7800 Pure hypercholesterolemia, unspecified: Secondary | ICD-10-CM

## 2021-08-20 ENCOUNTER — Encounter: Payer: Self-pay | Admitting: Neurology

## 2021-08-22 ENCOUNTER — Emergency Department (HOSPITAL_BASED_OUTPATIENT_CLINIC_OR_DEPARTMENT_OTHER): Payer: Medicare Other

## 2021-08-22 ENCOUNTER — Encounter (HOSPITAL_BASED_OUTPATIENT_CLINIC_OR_DEPARTMENT_OTHER): Payer: Self-pay | Admitting: Emergency Medicine

## 2021-08-22 ENCOUNTER — Other Ambulatory Visit: Payer: Self-pay

## 2021-08-22 ENCOUNTER — Emergency Department (HOSPITAL_BASED_OUTPATIENT_CLINIC_OR_DEPARTMENT_OTHER)
Admission: EM | Admit: 2021-08-22 | Discharge: 2021-08-22 | Disposition: A | Discharge status: Home / Self Care | Payer: Medicare Other | Attending: Emergency Medicine | Admitting: Emergency Medicine

## 2021-08-22 DIAGNOSIS — E039 Hypothyroidism, unspecified: Secondary | ICD-10-CM | POA: Diagnosis not present

## 2021-08-22 DIAGNOSIS — D49512 Neoplasm of unspecified behavior of left kidney: Secondary | ICD-10-CM | POA: Diagnosis not present

## 2021-08-22 DIAGNOSIS — F0390 Unspecified dementia without behavioral disturbance: Secondary | ICD-10-CM | POA: Insufficient documentation

## 2021-08-22 DIAGNOSIS — Z79899 Other long term (current) drug therapy: Secondary | ICD-10-CM | POA: Diagnosis not present

## 2021-08-22 DIAGNOSIS — K5792 Diverticulitis of intestine, part unspecified, without perforation or abscess without bleeding: Secondary | ICD-10-CM | POA: Insufficient documentation

## 2021-08-22 DIAGNOSIS — I10 Essential (primary) hypertension: Secondary | ICD-10-CM | POA: Insufficient documentation

## 2021-08-22 DIAGNOSIS — R1011 Right upper quadrant pain: Secondary | ICD-10-CM | POA: Diagnosis present

## 2021-08-22 DIAGNOSIS — N2889 Other specified disorders of kidney and ureter: Secondary | ICD-10-CM

## 2021-08-22 LAB — COMPREHENSIVE METABOLIC PANEL
ALT: 12 U/L (ref 0–44)
AST: 17 U/L (ref 15–41)
Albumin: 3.5 g/dL (ref 3.5–5.0)
Alkaline Phosphatase: 78 U/L (ref 38–126)
Anion gap: 10 (ref 5–15)
BUN: 16 mg/dL (ref 8–23)
CO2: 27 mmol/L (ref 22–32)
Calcium: 8.5 mg/dL — ABNORMAL LOW (ref 8.9–10.3)
Chloride: 96 mmol/L — ABNORMAL LOW (ref 98–111)
Creatinine, Ser: 1.19 mg/dL — ABNORMAL HIGH (ref 0.44–1.00)
GFR, Estimated: 46 mL/min — ABNORMAL LOW (ref >60)
Glucose, Bld: 157 mg/dL — ABNORMAL HIGH (ref 70–99)
Potassium: 3.8 mmol/L (ref 3.5–5.1)
Sodium: 133 mmol/L — ABNORMAL LOW (ref 135–145)
Total Bilirubin: 0.9 mg/dL (ref 0.3–1.2)
Total Protein: 6.6 g/dL (ref 6.5–8.1)

## 2021-08-22 LAB — CBC WITH DIFFERENTIAL/PLATELET
Abs Immature Granulocytes: 0.02 10*3/uL (ref 0.00–0.07)
Basophils Absolute: 0 10*3/uL (ref 0.0–0.1)
Basophils Relative: 0 %
Eosinophils Absolute: 0.1 10*3/uL (ref 0.0–0.5)
Eosinophils Relative: 1 %
HCT: 35.6 % — ABNORMAL LOW (ref 36.0–46.0)
Hemoglobin: 12.2 g/dL (ref 12.0–15.0)
Immature Granulocytes: 0 %
Lymphocytes Relative: 23 %
Lymphs Abs: 1.2 10*3/uL (ref 0.7–4.0)
MCH: 30.9 pg (ref 26.0–34.0)
MCHC: 34.3 g/dL (ref 30.0–36.0)
MCV: 90.1 fL (ref 80.0–100.0)
Monocytes Absolute: 0.5 10*3/uL (ref 0.1–1.0)
Monocytes Relative: 9 %
Neutro Abs: 3.6 10*3/uL (ref 1.7–7.7)
Neutrophils Relative %: 67 %
Platelets: 205 10*3/uL (ref 150–400)
RBC: 3.95 MIL/uL (ref 3.87–5.11)
RDW: 13 % (ref 11.5–15.5)
WBC: 5.3 10*3/uL (ref 4.0–10.5)
nRBC: 0 % (ref 0.0–0.2)

## 2021-08-22 LAB — TROPONIN I (HIGH SENSITIVITY): Troponin I (High Sensitivity): 3 ng/L (ref <18)

## 2021-08-22 LAB — LIPASE, BLOOD: Lipase: 29 U/L (ref 11–51)

## 2021-08-22 MED ORDER — IOHEXOL 300 MG/ML  SOLN
80.0000 mL | Freq: Once | INTRAMUSCULAR | Status: AC | PRN
  Administered 2021-08-22: 80 mL via INTRAVENOUS

## 2021-08-22 MED ORDER — METRONIDAZOLE 500 MG PO TABS: 500.0000 mg | ORAL_TABLET | Freq: Two times a day (BID) | ORAL | 0 refills | Status: AC

## 2021-08-22 MED ORDER — PANTOPRAZOLE SODIUM 40 MG PO TBEC: 40.0000 mg | DELAYED_RELEASE_TABLET | Freq: Every day | ORAL | 0 refills | Status: DC

## 2021-08-22 MED ORDER — CIPROFLOXACIN HCL 500 MG PO TABS
500.0000 mg | ORAL_TABLET | Freq: Two times a day (BID) | ORAL | 0 refills | Status: DC
Start: 2021-08-22 — End: 2021-11-29

## 2021-08-22 MED ORDER — ONDANSETRON 4 MG PO TBDP: 4.0000 mg | ORAL_TABLET | Freq: Three times a day (TID) | ORAL | 0 refills | Status: DC | PRN

## 2021-08-22 MED ORDER — SODIUM CHLORIDE 0.9 % IV BOLUS
1000.0000 mL | Freq: Once | INTRAVENOUS | Status: AC
  Administered 2021-08-22: 1000 mL via INTRAVENOUS

## 2021-08-22 NOTE — ED Provider Notes (Signed)
?[facility] ?Provider Note ? ? ?CSN: [id number] ?Arrival date & time: 08/22/21  1435 ? ? ?History ? ?Chief Complaint  ?Patient presents with  ? Abdominal Pain  ? ? ?Rebecca Hubbard is a 81 y.o. female. ? ?HPI ?81 year old female presents with upper abdominal pain.  History is primarily from the daughter given the patient's history of dementia.  She also has a history of hypertension, hypothyroidism, hyperlipidemia and GERD.  Patient's been dealing with about 2 weeks of epigastric pain.  Seems to be worst first thing in the morning.  Patient has been given Mylanta over the last few days which seems to temporarily help.  Daughter started on pantoprazole yesterday and was given another dose today.  Otherwise, there was concerned that it might be due to her Aricept and so this was stopped a couple days ago.  She has not complained of chest pain or had a fever.  No urinary symptoms.  She had a couple stools yesterday morning but otherwise has had normal bowel movements.  There has been no vomiting but she is burping a lot.  Daughter is concerned she might have a hiatal hernia. She's had a prior hysterectomy but no other abdominal surgeries. Patient denies any active pain currently. ? ?Home Medications ?Prior to Admission medications   ?Medication Sig Start Date End Date Taking? Authorizing Provider  ?ciprofloxacin (CIPRO) 500 MG tablet Take 1 tablet (500 mg total) by mouth every 12 (twelve) hours. 08/22/21  Yes Sherwood Gambler, MD  ?metroNIDAZOLE (FLAGYL) 500 MG tablet Take 1 tablet (500 mg total) by mouth 2 (two) times daily for 5 days. 08/22/21 08/27/21 Yes Sherwood Gambler, MD  ?ondansetron (ZOFRAN-ODT) 4 MG disintegrating tablet Take 1 tablet (4 mg total) by mouth every 8 (eight) hours as needed for nausea or vomiting. 08/22/21  Yes Sherwood Gambler, MD  ?pantoprazole (PROTONIX) 40 MG tablet Take 1 tablet (40 mg total) by mouth daily. 08/22/21  Yes Sherwood Gambler, MD  ?albuterol (VENTOLIN  HFA) 108 (90 Base) MCG/ACT inhaler Inhale 2 puffs into the lungs every 6 (six) hours as needed for wheezing or shortness of breath.  05/30/10   [provider]  ?Bepotastine Besilate 1.5 % SOLN Place 1 drop into both eyes daily as needed. 1.5 % 08/10/08   [provider]  ?betamethasone dipropionate 0.05 % cream Apply topically 2 (two) times daily as needed (Rash). 12/30/19   Clark-Burning, Anderson Malta, PA-C  ?calcium carbonate (OS-CAL) 600 MG TABS tablet Take 600 mg by mouth daily.    [provider]  ?celecoxib (CELEBREX) 100 MG capsule Take 100 mg by mouth 2 (two) times daily as needed. 06/21/21   [provider]  ?donepezil (ARICEPT) 10 MG tablet Take 1 tablet (10 mg total) by mouth at bedtime. 03/19/21   Melvenia Beam, MD  ?Ruthe Mannan 200-5 MCG/ACT AERO USE 2 PUFFS WITH SPACER EVERY 12 HOURS TO PREVENT COUGH OR WHEEZE, RINSE, GARGLE AND SPIT AFTER USE 04/18/15   Kozlow, Donnamarie Poag, MD  ?escitalopram (LEXAPRO) 10 MG tablet Take 1 tablet (10 mg total) by mouth daily. 07/10/21   Leamon Arnt, MD  ?ezetimibe (ZETIA) 10 MG tablet TAKE 1 TABLET BY MOUTH EVERY DAY 06/18/21   Leamon Arnt, MD  ?fexofenadine (ALLEGRA) 180 MG tablet Take 180 mg by mouth as needed.    [provider]  ?ketoconazole (NIZORAL) 2 % cream APPLY TO AFFECTED AREA(S) 2 TIMES A DAY AS NEEDED FOR 7 DAYS 01/05/21   Billey Chang  L, MD  ?latanoprost (XALATAN) 0.005 % ophthalmic solution Place 1 drop into both eyes at bedtime. 10/06/13   [provider]  ?levothyroxine (SYNTHROID) 100 MCG tablet Take 1 tablet (100 mcg total) by mouth daily. 09/20/20   Leamon Arnt, MD  ?meclizine (ANTIVERT) 12.5 MG tablet TAKE 1 TABLET BY MOUTH 3 TIMES DAILY AS NEEDED FOR DIZZINESS. 05/17/21   Ward Givens, NP  ?memantine (NAMENDA) 10 MG tablet TAKE 1 TABLET BY MOUTH TWICE A DAY 05/17/21   Ward Givens, NP  ?naproxen sodium (ALEVE) 220 MG tablet Take 220 mg by mouth daily as needed (pain).    [provider]   ?polyethylene glycol (MIRALAX / GLYCOLAX) 17 g packet Take 17 g by mouth as needed.    [provider]  ?rosuvastatin (CRESTOR) 5 MG tablet TAKE 1 TABLET BY MOUTH EVERYDAY AT BEDTIME 08/13/21   Leamon Arnt, MD  ?triamterene-hydrochlorothiazide [facility]) 37.5-25 MG tablet TAKE 1 TABLET BY MOUTH EVERY DAY 05/07/21   Leamon Arnt, MD  ?   ? ?Allergies    ?Penicillins, Sulfa antibiotics, Aspirin, and Atorvastatin   ? ?Review of Systems   ?Review of Systems  ?Constitutional:  Negative for fever.  ?Respiratory:  Negative for shortness of breath.   ?Cardiovascular:  Negative for chest pain.  ?Gastrointestinal:  Positive for abdominal pain. Negative for vomiting.  ? ?Physical Exam ?Updated Vital Signs ?BP (!) 186/71   Pulse (!) 58   Temp 98.2 ?F (36.8 ?C)   Resp (!) 22   SpO2 99%  ?Physical Exam ?Vitals and nursing note reviewed.  ?Constitutional:   ?   General: She is not in acute distress. ?   Appearance: She is well-developed. She is not ill-appearing or diaphoretic.  ?HENT:  ?   Head: Normocephalic and atraumatic.  ?Cardiovascular:  ?   Rate and Rhythm: Normal rate and regular rhythm.  ?   Heart sounds: Murmur heard.  ?Pulmonary:  ?   Effort: Pulmonary effort is normal.  ?   Breath sounds: Normal breath sounds.  ?Abdominal:  ?   Palpations: Abdomen is soft.  ?   Tenderness: There is abdominal tenderness in the right upper quadrant, epigastric area and left upper quadrant. Negative signs include Murphy's sign.  ?Skin: ?   General: Skin is warm and dry.  ?Neurological:  ?   Mental Status: She is alert.  ? ? ?ED Results / Procedures / Treatments   ?Labs ?(all labs ordered are listed, but only abnormal results are displayed) ?Labs Reviewed  ?COMPREHENSIVE METABOLIC PANEL - Abnormal; Notable for the following components:  ?    Result Value  ? Sodium 133 (*)   ? Chloride 96 (*)   ? Glucose, Bld 157 (*)   ? Creatinine, Ser 1.19 (*)   ? Calcium 8.5 (*)   ? GFR, Estimated 46 (*)   ? All other components  within normal limits  ?CBC WITH DIFFERENTIAL/PLATELET - Abnormal; Notable for the following components:  ? HCT 35.6 (*)   ? All other components within normal limits  ?LIPASE, BLOOD  ?URINALYSIS, ROUTINE W REFLEX MICROSCOPIC  ?TROPONIN I (HIGH SENSITIVITY)  ? ? ?EKG ?EKG Interpretation ? ?Date/Time:  Wednesday Aug 22 2021 14:44:55 EDT ?Ventricular Rate:  64 ?PR Interval:  180 ?QRS Duration: 70 ?QT Interval:  404 ?QTC Calculation: 416 ?R Axis:   33 ?Text Interpretation: Normal sinus rhythm Cannot rule out Anterior infarct , age undetermined similar to 2003 Confirmed by Sherwood Gambler [phone]) on 08/22/2021 3:02:30  PM ? ?Radiology ?CT ABDOMEN PELVIS W CONTRAST ? ?Result Date: 08/22/2021 ?CLINICAL DATA:  Acute left upper quadrant abdominal pain. EXAM: CT ABDOMEN AND PELVIS WITH CONTRAST TECHNIQUE: Multidetector CT imaging of the abdomen and pelvis was performed using the standard protocol following bolus administration of intravenous contrast. RADIATION DOSE REDUCTION: This exam was performed according to the departmental dose-optimization program which includes automated exposure control, adjustment of the mA and/or kV according to patient size and/or use of iterative reconstruction technique. CONTRAST:  58m OMNIPAQUE IOHEXOL 300 MG/ML  SOLN COMPARISON:  None. FINDINGS: Lower chest: No acute abnormality. Hepatobiliary: No gallstones or biliary dilatation is noted. Nodular hepatic contour is noted suggesting possible hepatic cirrhosis. Pancreas: Unremarkable. No pancreatic ductal dilatation or surrounding inflammatory changes. Spleen: Normal in size without focal abnormality. Adrenals/Urinary Tract: Adrenal glands appear normal. 3.4 x 2.5 cm complex exophytic mass is seen arising from lower pole of left kidney; this includes multiple calcifications and enhancing solid component. Malignancy cannot be excluded. Also noted is 12 mm enhancing nodule in upper pole of right kidney. 13 mm enhancing nodule is noted posteriorly  in midpole of right kidney. No hydronephrosis or renal obstruction is noted. Urinary bladder is unremarkable. Stomach/Bowel: The stomach appears normal. The appendix is not clearly visualized. There is no evidence of

## 2021-08-22 NOTE — Discharge Instructions (Addendum)
Your CT scan shows a mass on the left kidney.  You will need an MRI with and without gadolinium to further evaluate for this.  Your primary care doctor can arrange for this, call them in the morning. ? ?Otherwise the CT scan also showed some mild diverticulitis.  You have been prescribed antibiotics to treat this.  If you develop new or worsening abdominal pain, vomiting, fever, or any other new/concerning symptoms and return to the ER for evaluation. ? ?

## 2021-08-22 NOTE — ED Triage Notes (Signed)
Pt has dementia, daughter states this impacts communication. Pt had been having some n/v for few weeks, md recommend stopping aricept and she did that 2 days ago, but today she seems in more pain, she points to her abdomen. Daughter also states her moms color is pale for her.  ?

## 2021-08-23 ENCOUNTER — Encounter: Payer: Self-pay | Admitting: Family Medicine

## 2021-08-27 ENCOUNTER — Encounter: Payer: Self-pay | Admitting: Family Medicine

## 2021-08-27 ENCOUNTER — Ambulatory Visit (INDEPENDENT_AMBULATORY_CARE_PROVIDER_SITE_OTHER): Payer: Medicare Other | Admitting: Family Medicine

## 2021-08-27 VITALS — BP 120/60 | HR 62 | Temp 97.7°F | Ht 60.0 in | Wt 192.8 lb

## 2021-08-27 DIAGNOSIS — N2889 Other specified disorders of kidney and ureter: Secondary | ICD-10-CM

## 2021-08-27 DIAGNOSIS — K5792 Diverticulitis of intestine, part unspecified, without perforation or abscess without bleeding: Secondary | ICD-10-CM | POA: Diagnosis not present

## 2021-08-27 DIAGNOSIS — N179 Acute kidney failure, unspecified: Secondary | ICD-10-CM

## 2021-08-27 DIAGNOSIS — K2900 Acute gastritis without bleeding: Secondary | ICD-10-CM

## 2021-08-27 LAB — POCT URINALYSIS DIPSTICK
Bilirubin, UA: NEGATIVE
Blood, UA: NEGATIVE
Glucose, UA: NEGATIVE
Ketones, UA: NEGATIVE
Leukocytes, UA: NEGATIVE
Nitrite, UA: NEGATIVE
Protein, UA: NEGATIVE
Spec Grav, UA: 1.01 (ref 1.010–1.025)
Urobilinogen, UA: 0.2 E.U./dL
pH, UA: 6.5 (ref 5.0–8.0)

## 2021-08-27 MED ORDER — PANTOPRAZOLE SODIUM 40 MG PO TBEC: 40.0000 mg | DELAYED_RELEASE_TABLET | Freq: Every day | ORAL | 0 refills | Status: DC

## 2021-08-27 NOTE — Patient Instructions (Signed)
Please follow up as scheduled for your next visit with me: 10/02/2021  ? ?We will call you to schedule your MRI of your abdomen.  ?I will release your lab results to you on your MyChart account with further instructions. You may see the results before I do, but when I review them I will send you a message with my report or have my assistant call you if things need to be discussed. Please reply to my message with any questions. Thank you!  ? ?Please continue the protonix.  ? ?If you have any questions or concerns, please don't hesitate to send me a message via MyChart or call the office at [phone]. Thank you for visiting with [place] today! It's our pleasure caring for you.  ?

## 2021-08-27 NOTE — Progress Notes (Signed)
? ? ?Subjective  ?CC:  ?Chief Complaint  ?Patient presents with  ? Hospitalization Follow-up  ?  Pt was seen in the ed and was dx with diverticulitis and there was a mass on kidneys and they want to discuss a MRI   ? ? ?HPI: Rebecca Hubbard is a 81 y.o. female who presents to the office today to address the problems listed above in the chief complaint. ?81 year old female with frontotemporal dementia presents after ER visit for upper abdominal pain.  Symptoms most consistent with gastritis however, CT of abdomen showed possible diverticulitis.  Also found an exophytic left renal mass with possible masses on the right as well.  She was started on Protonix 40 mg daily and Cipro and Flagyl.  Since, she is doing well.  She had no true symptoms of diverticulitis, specifically no fever, leukocytosis or low abdominal pain.  She is taking not quite a weeks worth of antibiotics.  Overall she is doing well.  Today without pain.  However yesterday her daughter says she had belching and complained of upper abdominal symptoms.  No hematemesis.  No gross hematuria.  No diarrhea. ?Acute kidney injury: Mild bump in creatinine and lab work.  Otherwise lab work was unremarkable. ?No visits with results within 1 Day(s) from this visit.  ?Latest known visit with results is:  ?Admission on 08/22/2021, Discharged on 08/22/2021  ?Component Date Value Ref Range Status  ? Sodium 08/22/2021 133 (L)  135 - 145 mmol/L Final  ? Potassium 08/22/2021 3.8  3.5 - 5.1 mmol/L Final  ? Chloride 08/22/2021 96 (L)  98 - 111 mmol/L Final  ? CO2 08/22/2021 27  22 - 32 mmol/L Final  ? Glucose, Bld 08/22/2021 157 (H)  70 - 99 mg/dL Final  ? BUN 08/22/2021 16  8 - 23 mg/dL Final  ? Creatinine, Ser 08/22/2021 1.19 (H)  0.44 - 1.00 mg/dL Final  ? Calcium 08/22/2021 8.5 (L)  8.9 - 10.3 mg/dL Final  ? Total Protein 08/22/2021 6.6  6.5 - 8.1 g/dL Final  ? Albumin 08/22/2021 3.5  3.5 - 5.0 g/dL Final  ? AST 08/22/2021 17  15 - 41 U/L Final  ? ALT 08/22/2021  12  0 - 44 U/L Final  ? Alkaline Phosphatase 08/22/2021 78  38 - 126 U/L Final  ? Total Bilirubin 08/22/2021 0.9  0.3 - 1.2 mg/dL Final  ? GFR, Estimated 08/22/2021 46 (L)  >60 mL/min Final  ? Anion gap 08/22/2021 10  5 - 15 Final  ? Troponin I (High Sensitivity) 08/22/2021 3  <18 ng/L Final  ? Lipase 08/22/2021 29  11 - 51 U/L Final  ? WBC 08/22/2021 5.3  4.0 - 10.5 K/uL Final  ? RBC 08/22/2021 3.95  3.87 - 5.11 MIL/uL Final  ? Hemoglobin 08/22/2021 12.2  12.0 - 15.0 g/dL Final  ? HCT 08/22/2021 35.6 (L)  36.0 - 46.0 % Final  ? MCV 08/22/2021 90.1  80.0 - 100.0 fL Final  ? MCH 08/22/2021 30.9  26.0 - 34.0 pg Final  ? MCHC 08/22/2021 34.3  30.0 - 36.0 g/dL Final  ? RDW 08/22/2021 13.0  11.5 - 15.5 % Final  ? Platelets 08/22/2021 205  150 - 400 K/uL Final  ? nRBC 08/22/2021 0.0  0.0 - 0.2 % Final  ? Neutrophils Relative % 08/22/2021 67  % Final  ? Neutro Abs 08/22/2021 3.6  1.7 - 7.7 K/uL Final  ? Lymphocytes Relative 08/22/2021 23  % Final  ? Lymphs Abs 08/22/2021  1.2  0.7 - 4.0 K/uL Final  ? Monocytes Relative 08/22/2021 9  % Final  ? Monocytes Absolute 08/22/2021 0.5  0.1 - 1.0 K/uL Final  ? Eosinophils Relative 08/22/2021 1  % Final  ? Eosinophils Absolute 08/22/2021 0.1  0.0 - 0.5 K/uL Final  ? Basophils Relative 08/22/2021 0  % Final  ? Basophils Absolute 08/22/2021 0.0  0.0 - 0.1 K/uL Final  ? Immature Granulocytes 08/22/2021 0  % Final  ? Abs Immature Granulocytes 08/22/2021 0.02  0.00 - 0.07 K/uL Final  ? ? ?Assessment  ?1. Left renal mass   ?2. AKI (acute kidney injury) ([place])   ?3. Acute gastritis without hemorrhage, unspecified gastritis type   ?4. Diverticulitis   ? ?  ?Plan  ?Left renal mass: Abdomen MRI ordered to further evaluate renal masses.  Likely will need urology.  Check UA today.  Counseling education given ?Acute kidney injury: Recheck BMP. ?Presumed gastritis: Continue Protonix 40 daily for the next 4 to 12 weeks. ?Diverticulitis by CT scan.  Patient can consider stopping  antibiotics. ? ?Follow up: As scheduled ?10/02/2021 ? ?Orders Placed This Encounter  ?Procedures  ? MR ABDOMEN W CONTRAST  ? Renal function panel  ? POCT urinalysis dipstick  ? ?Meds ordered this encounter  ?Medications  ? pantoprazole (PROTONIX) 40 MG tablet  ?  Sig: Take 1 tablet (40 mg total) by mouth daily.  ?  Dispense:  30 tablet  ?  Refill:  0  ? ?  ? ?I reviewed the patients updated PMH, FH, and SocHx.  ?  ?Patient Active Problem List  ? Diagnosis Date Noted  ? Frontotemporal dementia ([place]) 06/15/2020  ?  Priority: High  ? Depression, recurrent ([place]) 05/25/2018  ?  Priority: High  ? Mixed hyperlipidemia 03/08/2017  ?  Priority: High  ? Essential hypertension 10/12/2010  ?  Priority: High  ? Hypothyroidism due to acquired atrophy of thyroid 10/12/2010  ?  Priority: High  ? Glaucoma 05/25/2018  ?  Priority: Medium   ? Aortic stenosis, mild 09/10/2017  ?  Priority: Medium   ? Chronic tophaceous gout 07/08/2017  ?  Priority: Medium   ? LPRD (laryngopharyngeal reflux disease) 04/18/2015  ?  Priority: Medium   ? Moderate persistent asthma 04/18/2015  ?  Priority: Medium   ? Colon adenoma 11/03/2013  ?  Priority: Medium   ? Osteoarthritis 10/12/2010  ?  Priority: Medium   ? Urge incontinence of urine 07/08/2017  ?  Priority: Low  ? Other allergic rhinitis 04/18/2015  ?  Priority: Low  ? Statin intolerance 05/25/2018  ? ?Current Meds  ?Medication Sig  ? albuterol (VENTOLIN HFA) 108 (90 Base) MCG/ACT inhaler Inhale 2 puffs into the lungs every 6 (six) hours as needed for wheezing or shortness of breath.   ? Bepotastine Besilate 1.5 % SOLN Place 1 drop into both eyes daily as needed. 1.5 %  ? betamethasone dipropionate 0.05 % cream Apply topically 2 (two) times daily as needed (Rash).  ? calcium carbonate (OS-CAL) 600 MG TABS tablet Take 600 mg by mouth daily.  ? celecoxib (CELEBREX) 100 MG capsule Take 100 mg by mouth 2 (two) times daily as needed.  ? ciprofloxacin (CIPRO) 500 MG tablet Take 1 tablet (500 mg total) by  mouth every 12 (twelve) hours.  ? donepezil (ARICEPT) 10 MG tablet Take 1 tablet (10 mg total) by mouth at bedtime.  ? DULERA 200-5 MCG/ACT AERO USE 2 PUFFS WITH SPACER EVERY 12 HOURS TO PREVENT  COUGH OR WHEEZE, RINSE, GARGLE AND SPIT AFTER USE  ? escitalopram (LEXAPRO) 10 MG tablet Take 1 tablet (10 mg total) by mouth daily.  ? ezetimibe (ZETIA) 10 MG tablet TAKE 1 TABLET BY MOUTH EVERY DAY  ? fexofenadine (ALLEGRA) 180 MG tablet Take 180 mg by mouth as needed.  ? ketoconazole (NIZORAL) 2 % cream APPLY TO AFFECTED AREA(S) 2 TIMES A DAY AS NEEDED FOR 7 DAYS  ? latanoprost (XALATAN) 0.005 % ophthalmic solution Place 1 drop into both eyes at bedtime.  ? levothyroxine (SYNTHROID) 100 MCG tablet Take 1 tablet (100 mcg total) by mouth daily.  ? meclizine (ANTIVERT) 12.5 MG tablet TAKE 1 TABLET BY MOUTH 3 TIMES DAILY AS NEEDED FOR DIZZINESS.  ? memantine (NAMENDA) 10 MG tablet TAKE 1 TABLET BY MOUTH TWICE A DAY  ? metroNIDAZOLE (FLAGYL) 500 MG tablet Take 1 tablet (500 mg total) by mouth 2 (two) times daily for 5 days.  ? naproxen sodium (ALEVE) 220 MG tablet Take 220 mg by mouth daily as needed (pain).  ? ondansetron (ZOFRAN-ODT) 4 MG disintegrating tablet Take 1 tablet (4 mg total) by mouth every 8 (eight) hours as needed for nausea or vomiting.  ? polyethylene glycol (MIRALAX / GLYCOLAX) 17 g packet Take 17 g by mouth as needed.  ? rosuvastatin (CRESTOR) 5 MG tablet TAKE 1 TABLET BY MOUTH EVERYDAY AT BEDTIME  ? triamterene-hydrochlorothiazide (MAXZIDE-25) 37.5-25 MG tablet TAKE 1 TABLET BY MOUTH EVERY DAY  ? [DISCONTINUED] pantoprazole (PROTONIX) 40 MG tablet Take 1 tablet (40 mg total) by mouth daily.  ? ? ?Allergies: ?Patient is allergic to penicillins, sulfa antibiotics, aspirin, and atorvastatin. ?Family History: ?Patient family history includes Alzheimer's disease in her father; Cancer in her sister; Stroke in her mother. ?Social History:  ?Patient  reports that she quit smoking about 41 years ago. Her smoking use  included cigarettes. She has never used smokeless tobacco. She reports that she does not currently use alcohol after a past usage of about 5.0 standard drinks per week. She reports that she does not use drugs. ? ?Revie

## 2021-08-28 LAB — RENAL FUNCTION PANEL
Albumin: 3.5 g/dL (ref 3.5–5.2)
BUN: 11 mg/dL (ref 6–23)
CO2: 27 mEq/L (ref 19–32)
Calcium: 8.4 mg/dL (ref 8.4–10.5)
Chloride: 98 mEq/L (ref 96–112)
Creatinine, Ser: 0.96 mg/dL (ref 0.40–1.20)
GFR: 55.07 mL/min — ABNORMAL LOW (ref >60.00)
Glucose, Bld: 128 mg/dL — ABNORMAL HIGH (ref 70–99)
Phosphorus: 2.7 mg/dL (ref 2.3–4.6)
Potassium: 3.5 mEq/L (ref 3.5–5.1)
Sodium: 133 mEq/L — ABNORMAL LOW (ref 135–145)

## 2021-09-04 ENCOUNTER — Ambulatory Visit
Admission: RE | Admit: 2021-09-04 | Discharge: 2021-09-04 | Disposition: A | Discharge status: Home / Self Care | Payer: Medicare Other | Source: Ambulatory Visit | Attending: Family Medicine | Admitting: Family Medicine

## 2021-09-04 DIAGNOSIS — N179 Acute kidney failure, unspecified: Secondary | ICD-10-CM

## 2021-09-04 DIAGNOSIS — N2889 Other specified disorders of kidney and ureter: Secondary | ICD-10-CM

## 2021-09-04 MED ORDER — GADOBENATE DIMEGLUMINE 529 MG/ML IV SOLN
18.0000 mL | Freq: Once | INTRAVENOUS | Status: AC | PRN
  Administered 2021-09-04: 18 mL via INTRAVENOUS

## 2021-09-05 ENCOUNTER — Encounter: Payer: Self-pay | Admitting: Family Medicine

## 2021-09-06 NOTE — Progress Notes (Signed)
Please call patient: I have reviewed his/her lab results. The MRI confirms a concerning renal mass that could be a kidney cancer. Please refer to urology for further evaluation and management. Urgent referral. Thanks.

## 2021-09-07 ENCOUNTER — Other Ambulatory Visit: Payer: Self-pay

## 2021-09-07 DIAGNOSIS — N2889 Other specified disorders of kidney and ureter: Secondary | ICD-10-CM

## 2021-09-07 NOTE — Telephone Encounter (Signed)
This has been addressed.

## 2021-09-11 ENCOUNTER — Encounter: Payer: Self-pay | Admitting: Family Medicine

## 2021-09-18 ENCOUNTER — Encounter: Payer: Self-pay | Admitting: Family Medicine

## 2021-09-27 ENCOUNTER — Other Ambulatory Visit: Payer: Self-pay | Admitting: Family Medicine

## 2021-09-27 DIAGNOSIS — E034 Atrophy of thyroid (acquired): Secondary | ICD-10-CM

## 2021-10-02 ENCOUNTER — Ambulatory Visit: Payer: Commercial Managed Care - PPO | Admitting: Family Medicine

## 2021-10-06 ENCOUNTER — Other Ambulatory Visit: Payer: Self-pay | Admitting: Family Medicine

## 2021-10-08 ENCOUNTER — Other Ambulatory Visit (HOSPITAL_COMMUNITY): Payer: Self-pay | Admitting: Urology

## 2021-10-08 ENCOUNTER — Ambulatory Visit (HOSPITAL_COMMUNITY)
Admission: RE | Admit: 2021-10-08 | Discharge: 2021-10-08 | Disposition: A | Discharge status: Home / Self Care | Payer: Medicare Other | Source: Ambulatory Visit | Attending: Urology | Admitting: Urology

## 2021-10-08 DIAGNOSIS — D49512 Neoplasm of unspecified behavior of left kidney: Secondary | ICD-10-CM | POA: Diagnosis present

## 2021-10-29 ENCOUNTER — Ambulatory Visit: Payer: Medicare Other | Admitting: Adult Health

## 2021-11-01 ENCOUNTER — Other Ambulatory Visit: Payer: Self-pay | Admitting: Family Medicine

## 2021-11-01 DIAGNOSIS — E7800 Pure hypercholesterolemia, unspecified: Secondary | ICD-10-CM

## 2021-11-16 ENCOUNTER — Other Ambulatory Visit: Payer: Self-pay | Admitting: Adult Health

## 2021-11-29 ENCOUNTER — Encounter: Payer: Self-pay | Admitting: Family Medicine

## 2021-11-29 ENCOUNTER — Ambulatory Visit (INDEPENDENT_AMBULATORY_CARE_PROVIDER_SITE_OTHER): Payer: Medicare Other | Admitting: Family Medicine

## 2021-11-29 VITALS — BP 116/60 | HR 72 | Temp 98.7°F | Ht 60.0 in | Wt 184.2 lb

## 2021-11-29 DIAGNOSIS — R630 Anorexia: Secondary | ICD-10-CM

## 2021-11-29 DIAGNOSIS — I10 Essential (primary) hypertension: Secondary | ICD-10-CM | POA: Diagnosis not present

## 2021-11-29 DIAGNOSIS — C642 Malignant neoplasm of left kidney, except renal pelvis: Secondary | ICD-10-CM

## 2021-11-29 DIAGNOSIS — F339 Major depressive disorder, recurrent, unspecified: Secondary | ICD-10-CM | POA: Diagnosis not present

## 2021-11-29 DIAGNOSIS — E782 Mixed hyperlipidemia: Secondary | ICD-10-CM

## 2021-11-29 DIAGNOSIS — E034 Atrophy of thyroid (acquired): Secondary | ICD-10-CM

## 2021-11-29 DIAGNOSIS — G3109 Other frontotemporal dementia: Secondary | ICD-10-CM

## 2021-11-29 DIAGNOSIS — F0280 Dementia in other diseases classified elsewhere without behavioral disturbance: Secondary | ICD-10-CM

## 2021-11-29 LAB — COMPREHENSIVE METABOLIC PANEL
ALT: 15 U/L (ref 0–35)
AST: 14 U/L (ref 0–37)
Albumin: 3.6 g/dL (ref 3.5–5.2)
Alkaline Phosphatase: 89 U/L (ref 39–117)
BUN: 20 mg/dL (ref 6–23)
CO2: 25 mEq/L (ref 19–32)
Calcium: 8.4 mg/dL (ref 8.4–10.5)
Chloride: 100 mEq/L (ref 96–112)
Creatinine, Ser: 1.17 mg/dL (ref 0.40–1.20)
GFR: 43.36 mL/min — ABNORMAL LOW (ref >60.00)
Glucose, Bld: 101 mg/dL — ABNORMAL HIGH (ref 70–99)
Potassium: 3.8 mEq/L (ref 3.5–5.1)
Sodium: 138 mEq/L (ref 135–145)
Total Bilirubin: 1 mg/dL (ref 0.2–1.2)
Total Protein: 6 g/dL (ref 6.0–8.3)

## 2021-11-29 LAB — LIPID PANEL
Cholesterol: 158 mg/dL (ref 0–200)
HDL: 60.1 mg/dL (ref >39.00)
LDL Cholesterol: 81 mg/dL (ref 0–99)
NonHDL: 98.03
Total CHOL/HDL Ratio: 3
Triglycerides: 83 mg/dL (ref 0.0–149.0)
VLDL: 16.6 mg/dL (ref 0.0–40.0)

## 2021-11-29 LAB — CBC WITH DIFFERENTIAL/PLATELET
Basophils Absolute: 0 10*3/uL (ref 0.0–0.1)
Basophils Relative: 0.7 % (ref 0.0–3.0)
Eosinophils Absolute: 0.1 10*3/uL (ref 0.0–0.7)
Eosinophils Relative: 1 % (ref 0.0–5.0)
HCT: 38.6 % (ref 36.0–46.0)
Hemoglobin: 13.3 g/dL (ref 12.0–15.0)
Lymphocytes Relative: 18.7 % (ref 12.0–46.0)
Lymphs Abs: 0.9 10*3/uL (ref 0.7–4.0)
MCHC: 34.4 g/dL (ref 30.0–36.0)
MCV: 91.8 fl (ref 78.0–100.0)
Monocytes Absolute: 0.4 10*3/uL (ref 0.1–1.0)
Monocytes Relative: 7.6 % (ref 3.0–12.0)
Neutro Abs: 3.6 10*3/uL (ref 1.4–7.7)
Neutrophils Relative %: 72 % (ref 43.0–77.0)
Platelets: 182 10*3/uL (ref 150.0–400.0)
RBC: 4.2 Mil/uL (ref 3.87–5.11)
RDW: 14.6 % (ref 11.5–15.5)
WBC: 5 10*3/uL (ref 4.0–10.5)

## 2021-11-29 LAB — TSH: TSH: 0.28 u[IU]/mL — ABNORMAL LOW (ref 0.35–5.50)

## 2021-11-29 NOTE — Progress Notes (Signed)
Subjective  CC:  Chief Complaint  Patient presents with   not eating well    Pt has not been eating well, sweating and has lost some wt since last seen. Only eats 1/2 of her meal and is only eating 1 1/2 meal per day.     HPI: Rebecca Hubbard is a 81 y.o. female who presents to the office today to address the problems listed above in the chief complaint. 81 yo with dementia, htn, hld, hypothyroidism and likely RCC presents with daughter who is concerned about decreased appetite, weight loss and she notes that in the am when she gets dressed, she sweats. Pt is mildly confused but denies any problems and says she feels well although she admits she is not that hungry. She denies abdominal pain. She is alert and oriented to place and month/year. She take levothyroxing 192mg daily for thyroid. No palpitations, other sweats, tremor or jitteriness. Daughter reports her confusion is worsening. No lightheadedness. No cp. Sleep is fair. Mood is improved on lexapro (changed from remeron) but at times still feels low.  HTN is controlled by home readings w/o highs or lows On zetia for HLD and due for recheck. Intolerant to statins.  Wt Readings from Last 3 Encounters:  11/29/21 184 lb 3.2 oz (83.6 kg)  08/27/21 192 lb 12.8 oz (87.5 kg)  07/10/21 192 lb 6.4 oz (87.3 kg)   BP Readings from Last 3 Encounters:  11/29/21 116/60  08/27/21 120/60  08/22/21 (!) 186/71    Assessment  1. Frontotemporal dementia ([place]   2. Depression, recurrent ([place]   3. Hypothyroidism due to acquired atrophy of thyroid   4. Essential hypertension   5. Renal malignant neoplasm, left ([place]   6. Decreased appetite   7. Mixed hyperlipidemia      Plan  FTD:  suspect change in appetite and secondary weight loss is multifactorial: stopping remeron, summer (eating less in the heat), and cognitive decline. Will check labwork to ensure no other problems identified. Discussed with daughter supporting her nutritional  needs with high protein supplements/foods and healthy fats. Continue namenda.  Depression: improving on lexapro.  Recheck thyroid.  Discussed monitoring of RCC. No treatment indicated at this time. Reviewed renal notes.  HLD: recheck nonfasting lipids.  HTN: well controlled. Continue home monitoring. May be able to decrease meds if weight trends downward.   I spent a total of 42 minutes for this patient encounter. Time spent included preparation, face-to-face counseling with the patient and coordination of care, review of chart and records, and documentation of the encounter.  Follow up: 6 mo for recheck.   Visit date not found  Orders Placed This Encounter  Procedures   CBC with Differential/Platelet   TSH   Comprehensive metabolic panel   Lipid panel   No orders of the defined types were placed in this encounter.     I reviewed the patients updated PMH, FH, and SocHx.    Patient Active Problem List   Diagnosis Date Noted   Renal malignant neoplasm, left ([facility] 11/29/2021    Priority: High   Frontotemporal dementia ([place] 06/15/2020    Priority: High   Depression, recurrent ([place] 05/25/2018    Priority: High   Mixed hyperlipidemia 03/08/2017    Priority: High   Essential hypertension 10/12/2010    Priority: High   Hypothyroidism due to acquired atrophy of thyroid 10/12/2010    Priority: High   Glaucoma 05/25/2018    Priority: Medium  Aortic stenosis, mild 09/10/2017    Priority: Medium    Chronic tophaceous gout 07/08/2017    Priority: Medium    LPRD (laryngopharyngeal reflux disease) 04/18/2015    Priority: Medium    Moderate persistent asthma 04/18/2015    Priority: Medium    Colon adenoma 11/03/2013    Priority: Medium    Osteoarthritis 10/12/2010    Priority: Medium    Urge incontinence of urine 07/08/2017    Priority: Low   Other allergic rhinitis 04/18/2015    Priority: Low   Statin intolerance 05/25/2018   Current Meds  Medication Sig   albuterol  (VENTOLIN HFA) 108 (90 Base) MCG/ACT inhaler Inhale 2 puffs into the lungs every 6 (six) hours as needed for wheezing or shortness of breath.    Bepotastine Besilate 1.5 % SOLN Place 1 drop into both eyes daily as needed. 1.5 %   betamethasone dipropionate 0.05 % cream Apply topically 2 (two) times daily as needed (Rash).   calcium carbonate (OS-CAL) 600 MG TABS tablet Take 600 mg by mouth daily.   DULERA 200-5 MCG/ACT AERO USE 2 PUFFS WITH SPACER EVERY 12 HOURS TO PREVENT COUGH OR WHEEZE, RINSE, GARGLE AND SPIT AFTER USE   escitalopram (LEXAPRO) 10 MG tablet TAKE 1 TABLET BY MOUTH EVERY DAY   ezetimibe (ZETIA) 10 MG tablet TAKE 1 TABLET BY MOUTH EVERY DAY   fexofenadine (ALLEGRA) 180 MG tablet Take 180 mg by mouth as needed.   ketoconazole (NIZORAL) 2 % cream APPLY TO AFFECTED AREA(S) 2 TIMES A DAY AS NEEDED FOR 7 DAYS   latanoprost (XALATAN) 0.005 % ophthalmic solution Place 1 drop into both eyes at bedtime.   meclizine (ANTIVERT) 12.5 MG tablet TAKE 1 TABLET BY MOUTH 3 TIMES DAILY AS NEEDED FOR DIZZINESS.   memantine (NAMENDA) 10 MG tablet TAKE 1 TABLET BY MOUTH TWICE A DAY   naproxen sodium (ALEVE) 220 MG tablet Take 220 mg by mouth daily as needed (pain).   ondansetron (ZOFRAN-ODT) 4 MG disintegrating tablet Take 1 tablet (4 mg total) by mouth every 8 (eight) hours as needed for nausea or vomiting.   pantoprazole (PROTONIX) 40 MG tablet Take 1 tablet (40 mg total) by mouth daily.   polyethylene glycol (MIRALAX / GLYCOLAX) 17 g packet Take 17 g by mouth as needed.   rosuvastatin (CRESTOR) 5 MG tablet TAKE 1 TABLET BY MOUTH EVERYDAY AT BEDTIME   SYNTHROID 100 MCG tablet TAKE 1 TABLET BY MOUTH EVERY DAY   triamterene-hydrochlorothiazide (MAXZIDE-25) 37.5-25 MG tablet TAKE 1 TABLET BY MOUTH EVERY DAY    Allergies: Patient is allergic to penicillins, sulfa antibiotics, aspirin, and atorvastatin. Family History: Patient family history includes Alzheimer's disease in her father; Cancer in her  sister; Stroke in her mother. Social History:  Patient  reports that she quit smoking about 41 years ago. Her smoking use included cigarettes. She has never used smokeless tobacco. She reports that she does not currently use alcohol after a past usage of about 5.0 standard drinks of alcohol per week. She reports that she does not use drugs.  Review of Systems: Constitutional: Negative for fever malaise or anorexia Cardiovascular: negative for chest pain Respiratory: negative for SOB or persistent cough Gastrointestinal: negative for abdominal pain  Objective  Vitals: BP 116/60   Pulse 72   Temp 98.7 F (37.1 C)   Ht 5' (1.524 m)   Wt 184 lb 3.2 oz (83.6 kg)   SpO2 97%   BMI 35.97 kg/m  General: no acute distress ,  A&Ox3, looks well A&Ox2, gets confused easily HEENT: PEERL, conjunctiva normal, neck is supple Cardiovascular:  RRR without murmur or gallop.  Respiratory:  Good breath sounds bilaterally, CTAB with normal respiratory effort Gastrointestinal: soft, flat abdomen, normal active bowel sounds, no palpable masses, no hepatosplenomegaly, no appreciated hernias nontender Skin:  Warm, no rashes No tremor No edema  Commons side effects, risks, benefits, and alternatives for medications and treatment plan prescribed today were discussed, and the patient expressed understanding of the given instructions. Patient is instructed to call or message via MyChart if he/she has any questions or concerns regarding our treatment plan. No barriers to understanding were identified. We discussed Red Flag symptoms and signs in detail. Patient expressed understanding regarding what to do in case of urgent or emergency type symptoms.  Medication list was reconciled, printed and provided to the patient in AVS. Patient instructions and summary information was reviewed with the patient as documented in the AVS. This note was prepared with assistance of Dragon voice recognition software. Occasional  wrong-word or sound-a-like substitutions may have occurred due to the inherent limitations of voice recognition software  This visit occurred during the SARS-CoV-2 public health emergency.  Safety protocols were in place, including screening questions prior to the visit, additional usage of staff PPE, and extensive cleaning of exam room while observing appropriate contact time as indicated for disinfecting solutions.

## 2021-11-29 NOTE — Patient Instructions (Signed)
Please return in 6 months for recheck.   I will release your lab results to you on your MyChart account with further instructions. You may see the results before I do, but when I review them I will send you a message with my report or have my assistant call you if things need to be discussed. Please reply to my message with any questions. Thank you!   If you have any questions or concerns, please don't hesitate to send me a message via MyChart or call the office at [phone]. Thank you for visiting with us today! It's our pleasure caring for you.  

## 2021-11-30 ENCOUNTER — Encounter: Payer: Self-pay | Admitting: Family Medicine

## 2021-11-30 MED ORDER — LEVOTHYROXINE SODIUM 88 MCG PO TABS: 88.0000 ug | ORAL_TABLET | Freq: Every day | ORAL | 3 refills | Status: DC

## 2021-11-30 NOTE — Addendum Note (Signed)
Addended by: Billey Chang on: 11/30/2021 12:37 PM   Modules accepted: Orders

## 2021-12-18 ENCOUNTER — Ambulatory Visit: Payer: Medicare Other | Admitting: Adult Health

## 2021-12-18 ENCOUNTER — Encounter: Payer: Self-pay | Admitting: Adult Health

## 2022-01-06 ENCOUNTER — Other Ambulatory Visit: Payer: Self-pay | Admitting: Family Medicine

## 2022-01-07 ENCOUNTER — Encounter: Payer: Self-pay | Admitting: *Deleted

## 2022-01-19 ENCOUNTER — Other Ambulatory Visit: Payer: Self-pay | Admitting: Family Medicine

## 2022-02-04 ENCOUNTER — Other Ambulatory Visit: Payer: Self-pay | Admitting: Family Medicine

## 2022-02-04 DIAGNOSIS — E7800 Pure hypercholesterolemia, unspecified: Secondary | ICD-10-CM

## 2022-02-07 ENCOUNTER — Other Ambulatory Visit: Payer: Self-pay | Admitting: Urology

## 2022-02-07 DIAGNOSIS — D49512 Neoplasm of unspecified behavior of left kidney: Secondary | ICD-10-CM

## 2022-03-01 ENCOUNTER — Other Ambulatory Visit: Payer: Self-pay | Admitting: Adult Health

## 2022-03-28 ENCOUNTER — Encounter: Payer: Self-pay | Admitting: *Deleted

## 2022-04-11 ENCOUNTER — Ambulatory Visit
Admission: RE | Admit: 2022-04-11 | Discharge: 2022-04-11 | Disposition: A | Discharge status: Home / Self Care | Payer: Medicare Other | Source: Ambulatory Visit | Attending: Urology | Admitting: Urology

## 2022-04-11 DIAGNOSIS — D49512 Neoplasm of unspecified behavior of left kidney: Secondary | ICD-10-CM

## 2022-04-11 MED ORDER — GADOPICLENOL 0.5 MMOL/ML IV SOLN
9.0000 mL | Freq: Once | INTRAVENOUS | Status: AC | PRN
  Administered 2022-04-11: 9 mL via INTRAVENOUS

## 2022-04-29 ENCOUNTER — Other Ambulatory Visit: Payer: Self-pay | Admitting: Adult Health

## 2022-05-02 ENCOUNTER — Other Ambulatory Visit: Payer: Self-pay | Admitting: Family Medicine

## 2022-05-22 ENCOUNTER — Ambulatory Visit: Payer: Commercial Managed Care - PPO | Admitting: Family Medicine

## 2022-05-24 ENCOUNTER — Ambulatory Visit (INDEPENDENT_AMBULATORY_CARE_PROVIDER_SITE_OTHER): Payer: Medicare Other | Admitting: Family Medicine

## 2022-05-24 ENCOUNTER — Encounter: Payer: Self-pay | Admitting: Family Medicine

## 2022-05-24 VITALS — BP 110/50 | Temp 97.7°F | Ht 60.0 in | Wt 183.8 lb

## 2022-05-24 DIAGNOSIS — E782 Mixed hyperlipidemia: Secondary | ICD-10-CM | POA: Diagnosis not present

## 2022-05-24 DIAGNOSIS — G3109 Other frontotemporal dementia: Secondary | ICD-10-CM

## 2022-05-24 DIAGNOSIS — Z23 Encounter for immunization: Secondary | ICD-10-CM | POA: Diagnosis not present

## 2022-05-24 DIAGNOSIS — I10 Essential (primary) hypertension: Secondary | ICD-10-CM

## 2022-05-24 DIAGNOSIS — E034 Atrophy of thyroid (acquired): Secondary | ICD-10-CM | POA: Diagnosis not present

## 2022-05-24 DIAGNOSIS — F339 Major depressive disorder, recurrent, unspecified: Secondary | ICD-10-CM

## 2022-05-24 DIAGNOSIS — F0280 Dementia in other diseases classified elsewhere without behavioral disturbance: Secondary | ICD-10-CM

## 2022-05-24 LAB — COMPREHENSIVE METABOLIC PANEL
ALT: 11 U/L (ref 0–35)
AST: 15 U/L (ref 0–37)
Albumin: 3.6 g/dL (ref 3.5–5.2)
Alkaline Phosphatase: 90 U/L (ref 39–117)
BUN: 14 mg/dL (ref 6–23)
CO2: 28 mEq/L (ref 19–32)
Calcium: 8.6 mg/dL (ref 8.4–10.5)
Chloride: 100 mEq/L (ref 96–112)
Creatinine, Ser: 0.97 mg/dL (ref 0.40–1.20)
GFR: 54.11 mL/min — ABNORMAL LOW (ref >60.00)
Glucose, Bld: 113 mg/dL — ABNORMAL HIGH (ref 70–99)
Potassium: 3.6 mEq/L (ref 3.5–5.1)
Sodium: 139 mEq/L (ref 135–145)
Total Bilirubin: 1 mg/dL (ref 0.2–1.2)
Total Protein: 5.9 g/dL — ABNORMAL LOW (ref 6.0–8.3)

## 2022-05-24 LAB — CBC WITH DIFFERENTIAL/PLATELET
Basophils Absolute: 0 10*3/uL (ref 0.0–0.1)
Basophils Relative: 0.7 % (ref 0.0–3.0)
Eosinophils Absolute: 0.5 10*3/uL (ref 0.0–0.7)
Eosinophils Relative: 8.7 % — ABNORMAL HIGH (ref 0.0–5.0)
HCT: 38.2 % (ref 36.0–46.0)
Hemoglobin: 13.1 g/dL (ref 12.0–15.0)
Lymphocytes Relative: 24.1 % (ref 12.0–46.0)
Lymphs Abs: 1.3 10*3/uL (ref 0.7–4.0)
MCHC: 34.3 g/dL (ref 30.0–36.0)
MCV: 90 fl (ref 78.0–100.0)
Monocytes Absolute: 0.4 10*3/uL (ref 0.1–1.0)
Monocytes Relative: 8.4 % (ref 3.0–12.0)
Neutro Abs: 3.1 10*3/uL (ref 1.4–7.7)
Neutrophils Relative %: 58.1 % (ref 43.0–77.0)
Platelets: 225 10*3/uL (ref 150.0–400.0)
RBC: 4.24 Mil/uL (ref 3.87–5.11)
RDW: 13.8 % (ref 11.5–15.5)
WBC: 5.3 10*3/uL (ref 4.0–10.5)

## 2022-05-24 LAB — LIPID PANEL
Cholesterol: 157 mg/dL (ref 0–200)
HDL: 55.1 mg/dL (ref >39.00)
LDL Cholesterol: 80 mg/dL (ref 0–99)
NonHDL: 101.54
Total CHOL/HDL Ratio: 3
Triglycerides: 106 mg/dL (ref 0.0–149.0)
VLDL: 21.2 mg/dL (ref 0.0–40.0)

## 2022-05-24 LAB — TSH: TSH: 0.73 u[IU]/mL (ref 0.35–5.50)

## 2022-05-24 MED ORDER — ESCITALOPRAM OXALATE 10 MG PO TABS: 20.0000 mg | ORAL_TABLET | Freq: Every day | ORAL | 3 refills | Status: DC

## 2022-05-24 NOTE — Progress Notes (Unsigned)
Subjective  CC: No chief complaint on file.   HPI: Rebecca Hubbard is a 82 y.o. female who presents to the office today to address the problems listed above in the chief complaint. ***  Assessment  No diagnosis found.   Plan  ***:  ***  Follow up: No follow-ups on file.  Visit date not found  No orders of the defined types were placed in this encounter.  No orders of the defined types were placed in this encounter.     I reviewed the patients updated PMH, FH, and SocHx.    Patient Active Problem List   Diagnosis Date Noted   Renal malignant neoplasm, left ([place]) 11/29/2021    Priority: High   Frontotemporal dementia ([place]) 06/15/2020    Priority: High   Depression, recurrent ([place]) 05/25/2018    Priority: High   Mixed hyperlipidemia 03/08/2017    Priority: High   Essential hypertension 10/12/2010    Priority: High   Hypothyroidism due to acquired atrophy of thyroid 10/12/2010    Priority: High   Glaucoma 05/25/2018    Priority: Medium    Aortic stenosis, mild 09/10/2017    Priority: Medium    Chronic tophaceous gout 07/08/2017    Priority: Medium    LPRD (laryngopharyngeal reflux disease) 04/18/2015    Priority: Medium    Moderate persistent asthma 04/18/2015    Priority: Medium    Colon adenoma 11/03/2013    Priority: Medium    Osteoarthritis 10/12/2010    Priority: Medium    Urge incontinence of urine 07/08/2017    Priority: Low   Other allergic rhinitis 04/18/2015    Priority: Low   Statin intolerance 05/25/2018   No outpatient medications have been marked as taking for the 05/24/22 encounter (Appointment) with Leamon Arnt, MD.    Allergies: Patient is allergic to penicillins, sulfa antibiotics, aspirin, and atorvastatin. Family History: Patient family history includes Alzheimer's disease in her father; Cancer in her sister; Stroke in her mother. Social History:  Patient  reports that she quit smoking about 42 years ago. Her smoking use  included cigarettes. She has never used smokeless tobacco. She reports that she does not currently use alcohol after a past usage of about 5.0 standard drinks of alcohol per week. She reports that she does not use drugs.  Review of Systems: Constitutional: Negative for fever malaise or anorexia Cardiovascular: negative for chest pain Respiratory: negative for SOB or persistent cough Gastrointestinal: negative for abdominal pain  Objective  Vitals: There were no vitals taken for this visit. General: no acute distress , A&Ox3 HEENT: PEERL, conjunctiva normal, neck is supple Cardiovascular:  RRR without murmur or gallop.  Respiratory:  Good breath sounds bilaterally, CTAB with normal respiratory effort Skin:  Warm, no rashes  Commons side effects, risks, benefits, and alternatives for medications and treatment plan prescribed today were discussed, and the patient expressed understanding of the given instructions. Patient is instructed to call or message via MyChart if he/she has any questions or concerns regarding our treatment plan. No barriers to understanding were identified. We discussed Red Flag symptoms and signs in detail. Patient expressed understanding regarding what to do in case of urgent or emergency type symptoms.  Medication list was reconciled, printed and provided to the patient in AVS. Patient instructions and summary information was reviewed with the patient as documented in the AVS. This note was prepared with assistance of Dragon voice recognition software. Occasional wrong-word or sound-a-like substitutions may have occurred due to  the inherent limitations of voice recognition software

## 2022-05-24 NOTE — Patient Instructions (Signed)
Please return in 6-8 weeks to recheck your mood.  Take 2 escitalopram together every morning.  Start zyrtec at night.   If you have any questions or concerns, please don't hesitate to send me a message via MyChart or call the office at [phone]. Thank you for visiting with [place] today! It's our pleasure caring for you.

## 2022-07-04 ENCOUNTER — Ambulatory Visit: Payer: Commercial Managed Care - PPO | Admitting: Family Medicine

## 2022-07-22 ENCOUNTER — Ambulatory Visit (INDEPENDENT_AMBULATORY_CARE_PROVIDER_SITE_OTHER): Payer: Medicare Other | Admitting: Family Medicine

## 2022-07-22 ENCOUNTER — Encounter: Payer: Self-pay | Admitting: Family Medicine

## 2022-07-22 VITALS — BP 120/70 | HR 76 | Temp 98.2°F | Resp 16 | Ht 60.0 in | Wt 185.8 lb

## 2022-07-22 DIAGNOSIS — E782 Mixed hyperlipidemia: Secondary | ICD-10-CM

## 2022-07-22 DIAGNOSIS — C642 Malignant neoplasm of left kidney, except renal pelvis: Secondary | ICD-10-CM

## 2022-07-22 DIAGNOSIS — G3109 Other frontotemporal dementia: Secondary | ICD-10-CM

## 2022-07-22 DIAGNOSIS — F339 Major depressive disorder, recurrent, unspecified: Secondary | ICD-10-CM | POA: Diagnosis not present

## 2022-07-22 DIAGNOSIS — I10 Essential (primary) hypertension: Secondary | ICD-10-CM

## 2022-07-22 DIAGNOSIS — F0280 Dementia in other diseases classified elsewhere without behavioral disturbance: Secondary | ICD-10-CM

## 2022-07-22 DIAGNOSIS — E034 Atrophy of thyroid (acquired): Secondary | ICD-10-CM

## 2022-07-22 DIAGNOSIS — R21 Rash and other nonspecific skin eruption: Secondary | ICD-10-CM

## 2022-07-22 MED ORDER — ESCITALOPRAM OXALATE 20 MG PO TABS: 20.0000 mg | ORAL_TABLET | Freq: Every day | ORAL | 3 refills | Status: DC

## 2022-07-22 NOTE — Progress Notes (Signed)
Subjective  CC:  Chief Complaint  Patient presents with   Rash    Ongoing for 3 months    Depression    HPI: Rebecca Hubbard is a 82 y.o. female who presents to the office today to address the problems listed above in the chief complaint, mood problems. See last note.  Increase Lexapro to 20 mg daily.  She reports she is feeling well.  Daughter reports that she complained less about being depressed.  Overall they think she has had a good response.  No adverse effects.     07/22/2022    1:59 PM 05/24/2022   12:05 PM 11/29/2021   10:37 AM  Depression screen PHQ 2/9  Decreased Interest 1 2 0  Down, Depressed, Hopeless 0 2 0  PHQ - 2 Score 1 4 0  Altered sleeping 0 2   Tired, decreased energy 0 0   Change in appetite 0 0   Feeling bad or failure about yourself  0 0   Trouble concentrating 0 0   Moving slowly or fidgety/restless 0 0   Suicidal thoughts 0 0   PHQ-9 Score 1 6   Difficult doing work/chores Not difficult at all Somewhat difficult       07/22/2022    2:00 PM 05/24/2022   12:05 PM  GAD 7 : Generalized Anxiety Score  Nervous, Anxious, on Edge 0 2  Control/stop worrying 0 0  Worry too much - different things 0 0  Trouble relaxing 0 0  Restless 0 0  Easily annoyed or irritable 0 1  Afraid - awful might happen 0 0  Total GAD 7 Score 0 3  Anxiety Difficulty Not difficult at all Very difficult   Rash: Reviewed urgent care note.  Had rash on bilateral upper extremities and upper back.  Dry skin, itchy.  Treated with triamcinolone and skin moisturizers, improving.  Has not spread.  Minimal anterior chest or abdominal involvement.  No rash on legs.  Using Dove liquid soap.  No new detergents.  No systemic symptoms. Blood pressure remains well-controlled on current medications Reviewed labs from last visit, thyroid and lipids at goal  Assessment  1. Depression, recurrent   2. Renal malignant neoplasm, left Chronic  3. Essential hypertension   4. Frontotemporal  dementia   5. Rash and nonspecific skin eruption   6. Hypothyroidism due to acquired atrophy of thyroid   7. Mixed hyperlipidemia      Plan  Depression: Improved, continue Lexapro 20 mg daily. Reviewed concept of mood problems caused by biochemical imbalance of neurotransmitters and rationale for treatment with medications and therapy.  Counseling given: pt was instructed to contact office, on-call physician or crisis Hotline if symptoms worsen significantly. If patient develops any suicidal or homicidal thoughts, she is directed to the ER immediately.   Blood pressure and thyroid are well-controlled.  Continue current medications Continue Crestor 5 mg daily. Frontotemporal dementia on Namenda.  Stable.  Exhibit some loss of memory and confusion today but mostly responds well and is high functioning.  Reiterated that she is to not to drive Rash: Xerosis plus possibly other allergic reaction.  Improving.  Continue moisturizers.  Stop triamcinolone. Completed paperwork/exam form for her daycare program Follow up: 1-month follow-up and lab work No orders of the defined types were placed in this encounter.  Meds ordered this encounter  Medications   escitalopram (LEXAPRO) 20 MG tablet    Sig: Take 1 tablet (20 mg total) by mouth daily.  Dispense:  90 tablet    Refill:  3      I reviewed the patients updated PMH, FH, and SocHx.    Patient Active Problem List   Diagnosis Date Noted   Renal malignant neoplasm, left 11/29/2021    Priority: High   Frontotemporal dementia 06/15/2020    Priority: High   Depression, recurrent 05/25/2018    Priority: High   Mixed hyperlipidemia 03/08/2017    Priority: High   Essential hypertension 10/12/2010    Priority: High   Hypothyroidism due to acquired atrophy of thyroid 10/12/2010    Priority: High   Glaucoma 05/25/2018    Priority: Medium    Aortic stenosis, mild 09/10/2017    Priority: Medium    Chronic tophaceous gout 07/08/2017     Priority: Medium    LPRD (laryngopharyngeal reflux disease) 04/18/2015    Priority: Medium    Moderate persistent asthma 04/18/2015    Priority: Medium    Colon adenoma 11/03/2013    Priority: Medium    Osteoarthritis 10/12/2010    Priority: Medium    Urge incontinence of urine 07/08/2017    Priority: Low   Other allergic rhinitis 04/18/2015    Priority: Low   Statin intolerance 05/25/2018   Current Meds  Medication Sig   albuterol (VENTOLIN HFA) 108 (90 Base) MCG/ACT inhaler Inhale 2 puffs into the lungs every 6 (six) hours as needed for wheezing or shortness of breath.    Bepotastine Besilate 1.5 % SOLN Place 1 drop into both eyes daily as needed. 1.5 %   betamethasone dipropionate 0.05 % cream Apply topically 2 (two) times daily as needed (Rash).   calcium carbonate (OS-CAL) 600 MG TABS tablet Take 600 mg by mouth daily.   DULERA 200-5 MCG/ACT AERO USE 2 PUFFS WITH SPACER EVERY 12 HOURS TO PREVENT COUGH OR WHEEZE, RINSE, GARGLE AND SPIT AFTER USE   ezetimibe (ZETIA) 10 MG tablet TAKE 1 TABLET BY MOUTH EVERY DAY   fexofenadine (ALLEGRA) 180 MG tablet Take 180 mg by mouth as needed.   ketoconazole (NIZORAL) 2 % cream APPLY TO AFFECTED AREA(S) 2 TIMES A DAY AS NEEDED FOR 7 DAYS   latanoprost (XALATAN) 0.005 % ophthalmic solution Place 1 drop into both eyes at bedtime.   levothyroxine (SYNTHROID) 88 MCG tablet Take 1 tablet (88 mcg total) by mouth daily.   meclizine (ANTIVERT) 12.5 MG tablet TAKE 1 TABLET BY MOUTH 3 TIMES A DAY AS NEEDED FOR DIZZINESS   memantine (NAMENDA) 10 MG tablet TAKE 1 TABLET BY MOUTH TWICE A DAY   naproxen sodium (ALEVE) 220 MG tablet Take 220 mg by mouth daily as needed (pain).   ondansetron (ZOFRAN-ODT) 4 MG disintegrating tablet Take 1 tablet (4 mg total) by mouth every 8 (eight) hours as needed for nausea or vomiting.   pantoprazole (PROTONIX) 40 MG tablet Take 1 tablet (40 mg total) by mouth daily.   polyethylene glycol (MIRALAX / GLYCOLAX) 17 g packet  Take 17 g by mouth as needed.   rosuvastatin (CRESTOR) 5 MG tablet TAKE 1 TABLET BY MOUTH EVERYDAY AT BEDTIME   triamterene-hydrochlorothiazide (MAXZIDE-25) 37.5-25 MG tablet TAKE 1 TABLET BY MOUTH EVERY DAY   [DISCONTINUED] escitalopram (LEXAPRO) 10 MG tablet Take 2 tablets (20 mg total) by mouth daily.    Allergies: Patient is allergic to penicillins, sulfa antibiotics, aspirin, and atorvastatin. Family history:  Patient family history includes Alzheimer's disease in her father; Cancer in her sister; Stroke in her mother. Social History   Socioeconomic History  Marital status: Widowed    Spouse name: Not on file   Number of children: 2   Years of education: 3718 - undergrad plus 2 yrs of masters classes    Highest education level: Not on file  Occupational History   Occupation: retired    Associate Professormployer: OTHER    Comment: social Technical brewerworker/ teacher   Tobacco Use   Smoking status: Former    Types: Cigarettes    Quit date: 1982    Years since quitting: 42.2   Smokeless tobacco: Never  Vaping Use   Vaping Use: Never used  Substance and Sexual Activity   Alcohol use: Not Currently    Alcohol/week: 5.0 standard drinks of alcohol    Types: 5 Cans of beer per week    Comment: seltzers and margaritas   Drug use: No   Sexual activity: Not Currently  Other Topics Concern   Not on file  Social History Narrative   Patient lives at home with daughter.   Caffeine Use: 1-2 cups daily   Social Determinants of Health   Financial Resource Strain: Not on file  Food Insecurity: Not on file  Transportation Needs: Not on file  Physical Activity: Not on file  Stress: Not on file  Social Connections: Not on file     Review of Systems: Constitutional: Negative for fever malaise or anorexia Cardiovascular: negative for chest pain Respiratory: negative for SOB or persistent cough Gastrointestinal: negative for abdominal pain  Objective  Vitals: BP 120/70   Pulse 76   Temp 98.2 F (36.8 C)  (Temporal)   Resp 16   Ht 5' (1.524 m)   Wt 185 lb 12.8 oz (84.3 kg)   SpO2 99%   BMI 36.29 kg/m  General: no acute distress, well appearing, no apparent distress, well groomed Psych:  Alert and oriented x 3, happy affect today.  Cardiovascular:  RRR without murmur or gallop. no peripheral edema Respiratory:  Good breath sounds bilaterally, CTAB with normal respiratory effort Skin:  Warm, remnant scars on back, very dry skin on forearms.  No urticaria, flaking or vesicles  Commons side effects, risks, benefits, and alternatives for medications and treatment plan prescribed today were discussed, and the patient expressed understanding of the given instructions. Patient is instructed to call or message via MyChart if he/she has any questions or concerns regarding our treatment plan. No barriers to understanding were identified. We discussed Red Flag symptoms and signs in detail. Patient expressed understanding regarding what to do in case of urgent or emergency type symptoms.  Medication list was reconciled, printed and provided to the patient in AVS. Patient instructions and summary information was reviewed with the patient as documented in the AVS. This note was prepared with assistance of Dragon voice recognition software. Occasional wrong-word or sound-a-like substitutions may have occurred due to the inherent limitations of voice recognition software

## 2022-07-22 NOTE — Patient Instructions (Signed)
Please return in 6 months for recheck and lab work.   If you have any questions or concerns, please don't hesitate to send me a message via MyChart or call the office at [phone]. Thank you for visiting with [place] today! It's our pleasure caring for you.

## 2022-08-21 ENCOUNTER — Other Ambulatory Visit: Payer: Self-pay | Admitting: Adult Health

## 2022-11-14 ENCOUNTER — Other Ambulatory Visit: Payer: Self-pay | Admitting: Family Medicine

## 2022-11-17 ENCOUNTER — Other Ambulatory Visit: Payer: Self-pay | Admitting: Adult Health

## 2023-01-12 ENCOUNTER — Other Ambulatory Visit: Payer: Self-pay | Admitting: Family Medicine

## 2023-01-21 ENCOUNTER — Ambulatory Visit (INDEPENDENT_AMBULATORY_CARE_PROVIDER_SITE_OTHER): Payer: Medicare Other | Admitting: Family Medicine

## 2023-01-21 ENCOUNTER — Encounter: Payer: Self-pay | Admitting: Family Medicine

## 2023-01-21 VITALS — BP 122/66 | HR 67 | Temp 98.0°F | Ht 60.0 in | Wt 179.6 lb

## 2023-01-21 DIAGNOSIS — I10 Essential (primary) hypertension: Secondary | ICD-10-CM | POA: Diagnosis not present

## 2023-01-21 DIAGNOSIS — F339 Major depressive disorder, recurrent, unspecified: Secondary | ICD-10-CM

## 2023-01-21 DIAGNOSIS — F0280 Dementia in other diseases classified elsewhere without behavioral disturbance: Secondary | ICD-10-CM

## 2023-01-21 DIAGNOSIS — Z23 Encounter for immunization: Secondary | ICD-10-CM | POA: Diagnosis not present

## 2023-01-21 DIAGNOSIS — C642 Malignant neoplasm of left kidney, except renal pelvis: Secondary | ICD-10-CM

## 2023-01-21 DIAGNOSIS — E782 Mixed hyperlipidemia: Secondary | ICD-10-CM

## 2023-01-21 DIAGNOSIS — G3109 Other frontotemporal dementia: Secondary | ICD-10-CM

## 2023-01-21 DIAGNOSIS — E034 Atrophy of thyroid (acquired): Secondary | ICD-10-CM

## 2023-01-21 DIAGNOSIS — K219 Gastro-esophageal reflux disease without esophagitis: Secondary | ICD-10-CM

## 2023-01-21 LAB — LIPID PANEL
Cholesterol: 163 mg/dL (ref 0–200)
HDL: 63.2 mg/dL (ref >39.00)
LDL Cholesterol: 83 mg/dL (ref 0–99)
NonHDL: 99.39
Total CHOL/HDL Ratio: 3
Triglycerides: 81 mg/dL (ref 0.0–149.0)
VLDL: 16.2 mg/dL (ref 0.0–40.0)

## 2023-01-21 LAB — COMPREHENSIVE METABOLIC PANEL
ALT: 13 U/L (ref 0–35)
AST: 13 U/L (ref 0–37)
Albumin: 3.7 g/dL (ref 3.5–5.2)
Alkaline Phosphatase: 123 U/L — ABNORMAL HIGH (ref 39–117)
BUN: 12 mg/dL (ref 6–23)
CO2: 29 meq/L (ref 19–32)
Calcium: 8.8 mg/dL (ref 8.4–10.5)
Chloride: 99 meq/L (ref 96–112)
Creatinine, Ser: 0.88 mg/dL (ref 0.40–1.20)
GFR: 60.53 mL/min (ref >60.00)
Glucose, Bld: 95 mg/dL (ref 70–99)
Potassium: 3.5 meq/L (ref 3.5–5.1)
Sodium: 137 meq/L (ref 135–145)
Total Bilirubin: 0.9 mg/dL (ref 0.2–1.2)
Total Protein: 6.3 g/dL (ref 6.0–8.3)

## 2023-01-21 LAB — CBC WITH DIFFERENTIAL/PLATELET
Basophils Absolute: 0 10*3/uL (ref 0.0–0.1)
Basophils Relative: 0.6 % (ref 0.0–3.0)
Eosinophils Absolute: 0.1 10*3/uL (ref 0.0–0.7)
Eosinophils Relative: 1.8 % (ref 0.0–5.0)
HCT: 39.9 % (ref 36.0–46.0)
Hemoglobin: 13.5 g/dL (ref 12.0–15.0)
Lymphocytes Relative: 20 % (ref 12.0–46.0)
Lymphs Abs: 1.2 10*3/uL (ref 0.7–4.0)
MCHC: 33.7 g/dL (ref 30.0–36.0)
MCV: 91.4 fL (ref 78.0–100.0)
Monocytes Absolute: 0.4 10*3/uL (ref 0.1–1.0)
Monocytes Relative: 6.8 % (ref 3.0–12.0)
Neutro Abs: 4.4 10*3/uL (ref 1.4–7.7)
Neutrophils Relative %: 70.8 % (ref 43.0–77.0)
Platelets: 223 10*3/uL (ref 150.0–400.0)
RBC: 4.37 Mil/uL (ref 3.87–5.11)
RDW: 14.6 % (ref 11.5–15.5)
WBC: 6.2 10*3/uL (ref 4.0–10.5)

## 2023-01-21 LAB — TSH: TSH: 4.06 u[IU]/mL (ref 0.35–5.50)

## 2023-01-21 MED ORDER — ROSUVASTATIN CALCIUM 5 MG PO TABS
5.0000 mg | ORAL_TABLET | Freq: Every day | ORAL | 3 refills | Status: DC
Start: 2023-01-21 — End: 2024-02-16

## 2023-01-21 NOTE — Progress Notes (Unsigned)
Subjective  Chief Complaint  Patient presents with   Depression    Pt here for f/u on depression w/o any complaints    Dementia    Pt would like referral for home health     HPI: Rebecca Hubbard is a 82 y.o. female who presents to [facility] [place] today for a Female Wellness Visit. She also has the concerns and/or needs as listed above in the chief complaint. These will be addressed in addition to the Health Maintenance Visit.   Wellness Visit: annual visit with health maintenance review and exam  HM: Patient is doing fairly well.  Occasionally goes to day care program.  No recent falls.  Appetite is good.  Patient's daughter inquiring about home physical therapy however patient is not homebound.  Eligible for flu shot today. Chronic disease f/u and/or acute problem visit: (deemed necessary to be done in addition to the wellness visit): Hypertension: Feeling well. Taking medications w/o adverse effects. No symptoms of CHF, angina; no palpitations, sob, cp or lower extremity edema. Compliant with meds.  Hypothyroidism: Energy levels are good.  Sleep is good.  Maintained on levothyroxine 88 mcg daily. Depression: Continues with some active symptoms.  Likely multifactorial and also related to FTD: Remains on Namenda.  No new complaints or behavioral changes reported by daughter.  Continues on Lexapro 20 mg daily.  Daughter feels this has been helpful. History of kidney cancer LPRD had been on chronic PPI.  No longer taking.  Denies cough  Assessment  1. Essential hypertension   2. Need for influenza vaccination   3. Hypothyroidism due to acquired atrophy of thyroid   4. Mixed hyperlipidemia   5. Depression, recurrent (HCC)   6. Frontotemporal dementia (HCC)   7. Renal malignant neoplasm, left (HCC)   8. LPRD (laryngopharyngeal reflux disease)      Plan  Female Wellness Visit: Age appropriate Health Maintenance and Prevention measures were discussed with  patient. Included topics are cancer screening recommendations, ways to keep healthy (see AVS) including dietary and exercise recommendations, regular eye and dental care, use of seat belts, and avoidance of moderate alcohol use and tobacco use.  No screens indicated BMI: discussed patient's BMI and encouraged positive lifestyle modifications to help get to or maintain a target BMI. HM needs and immunizations were addressed and ordered. See below for orders. See HM and immunization section for updates.  High-dose flu shot given today Routine labs and screening tests ordered including cmp, cbc and lipids where appropriate. Discussed recommendations regarding Vit D and calcium supplementation (see AVS)  Chronic disease management visit and/or acute problem visit: Hypertension is well-controlled on Dyazide 1 daily.  Check renal function electrolytes. Hypothyroidism on levothyroxine 88 mcg daily.  Recheck TSH today.  Clinically euthyroid Counseling done for chronic depression.  On Lexapro.  Continue 20 mg daily. Recheck lipids and LFTs on rosuvastatin 5 mg daily.  Tolerating well FTD and Namenda.  Stable.  Pleasant Monitor renal function with history of renal cancer LPRD: No longer taking Protonix.  Monitor for recurrence of cough  I spent a total of 50 minutes for this patient encounter. Time spent included preparation, face-to-face counseling with the patient and coordination of care, review of chart and records, and documentation of the encounter.  Follow up: 6 months for recheck Orders Placed This Encounter  Procedures   Flu Vaccine Trivalent High Dose (Fluad)   CBC with Differential/Platelet   Comprehensive metabolic panel   Lipid panel  TSH   Meds ordered this encounter  Medications   rosuvastatin (CRESTOR) 5 MG tablet    Sig: Take 1 tablet (5 mg total) by mouth at bedtime.    Dispense:  90 tablet    Refill:  3      Body mass index is 35.08 kg/m. Wt Readings from Last 3  Encounters:  01/21/23 179 lb 9.6 oz (81.5 kg)  07/22/22 185 lb 12.8 oz (84.3 kg)  05/24/22 183 lb 12.8 oz (83.4 kg)     Patient Active Problem List   Diagnosis Date Noted Date Diagnosed   Renal malignant neoplasm, left (HCC) 11/29/2021     Priority: High   Frontotemporal dementia (HCC) 06/15/2020     Priority: High    neurocog testing 05/2019    Depression, recurrent (HCC) 05/25/2018     Priority: High    Sept  - January: since divorcing her husband many years ago.     Mixed hyperlipidemia 03/08/2017     Priority: High   Essential hypertension 10/12/2010     Priority: High   Hypothyroidism due to acquired atrophy of thyroid 10/12/2010     Priority: High   Glaucoma 05/25/2018     Priority: Medium    Aortic stenosis, mild 09/10/2017     Priority: Medium     Echo 08/2017:  2.6 m/s peak vel   26 mmHg peak grad  16 mmHg mean grad 1.0 cm2.  Normal systolic function.    Chronic tophaceous gout 07/08/2017     Priority: Medium    LPRD (laryngopharyngeal reflux disease) 04/18/2015     Priority: Medium     Treated with protonix 40 in past    Moderate persistent asthma 04/18/2015     Priority: Medium    Colon adenoma 11/03/2013     Priority: Medium    Osteoarthritis 10/12/2010     Priority: Medium    Urge incontinence of urine 07/08/2017     Priority: Low   Other allergic rhinitis 04/18/2015     Priority: Low   Statin intolerance 05/25/2018     crestor and lipitor    Health Maintenance  Topic Date Due   Medicare Annual Wellness (AWV)  03/21/2020   COVID-19 Vaccine (5 - 2023-24 season) 02/06/2023 (Originally 12/15/2022)   Pneumonia Vaccine 63+ Years old  Completed   INFLUENZA VACCINE  Completed   Zoster Vaccines- Shingrix  Completed   HPV VACCINES  Aged Out   DTaP/Tdap/Td  Discontinued   DEXA SCAN  Discontinued   Immunization History  Administered Date(s) Administered   Fluad Quad(high Dose 65+) 12/26/2020, 05/24/2022   Fluad Trivalent(High Dose 65+) 01/21/2023    Influenza Split 03/26/2012   Influenza, High Dose Seasonal PF 03/15/2014, 12/29/2014, 02/05/2016, 02/25/2017, 01/22/2018, 01/22/2019, 01/22/2019   Moderna Covid-19 Vaccine Bivalent Booster 28yrs & up 02/07/2022   Moderna SARS-COV2 Booster Vaccination 03/21/2020   Moderna Sars-Covid-2 Vaccination 04/27/2019, 05/25/2019, 12/01/2020   Pneumococcal Conjugate-13 01/27/2014   Pneumococcal Polysaccharide-23 03/23/2012, 03/26/2012   Zoster Recombinant(Shingrix) 09/24/2018, 01/25/2020   We updated and reviewed the patient's past history in detail and it is documented below. Allergies: Patient is allergic to penicillins, sulfa antibiotics, aspirin, and atorvastatin. Past Medical History Patient  has a past medical history of Arthritis, Asthma, Depression, recurrent (HCC) (05/25/2018), Frontotemporal dementia (HCC) (06/15/2020), GERD (gastroesophageal reflux disease), Glaucoma, Hyperlipidemia, Hypertension, Hypothyroidism, Seasonal allergies, and Vertigo. Past Surgical History Patient  has a past surgical history that includes Thyroidectomy; Abdominal hysterectomy; and Multiple tooth extractions. Family History: Patient family history  includes Alzheimer's disease in her father; Cancer in her sister; Stroke in her mother. Social History:  Patient  reports that she quit smoking about 42 years ago. Her smoking use included cigarettes. She has never used smokeless tobacco. She reports that she does not currently use alcohol after a past usage of about 5.0 standard drinks of alcohol per week. She reports that she does not use drugs.  Review of Systems: Constitutional: negative for fever or malaise Ophthalmic: negative for photophobia, double vision or loss of vision Cardiovascular: negative for chest pain, dyspnea on exertion, or new LE swelling Respiratory: negative for SOB or persistent cough Gastrointestinal: negative for abdominal pain, change in bowel habits or melena Genitourinary: negative for dysuria  or gross hematuria, no abnormal uterine bleeding or disharge Musculoskeletal: negative for new gait disturbance or muscular weakness Integumentary: negative for new or persistent rashes, no breast lumps Neurological: negative for TIA or stroke symptoms Psychiatric: negative for SI or delusions Allergic/Immunologic: negative for hives  Patient Care Team    Relationship Specialty Notifications Start End  Willow Ora, MD PCP - General Family Medicine  05/25/18   Nelson Chimes, MD Consulting Physician Ophthalmology  05/27/18   Derenda Mis, PA-C (Inactive)  Dermatology  12/30/19   Butch Penny, NP Consulting Physician Neurology  11/29/21   Jannifer Hick, MD Consulting Physician Urology  11/29/21     Objective  Vitals: BP 122/66   Pulse 67   Temp 98 F (36.7 C)   Ht 5' (1.524 m)   Wt 179 lb 9.6 oz (81.5 kg)   SpO2 97%   BMI 35.08 kg/m  General:  Well developed, well nourished, no acute distress  Psych:  Alert and orientedx3,normal mood and affect, confused when trying to answer some questions, often defers to daughter HEENT:  Normocephalic, atraumatic, non-icteric sclera,  supple neck without adenopathy, mass or thyromegaly Cardiovascular:  Normal S1, S2, RRR without gallop, rub or murmur Respiratory:  Good breath sounds bilaterally, CTAB with normal respiratory effort Gastrointestinal: normal bowel sounds, soft, non-tender, no noted masses. No HSM MSK: extremities without edema, joints without erythema or swelling Neurologic:    Mental status is normal.  Gross motor and sensory exams are normal.  No tremor  Commons side effects, risks, benefits, and alternatives for medications and treatment plan prescribed today were discussed, and the patient expressed understanding of the given instructions. Patient is instructed to call or message via MyChart if he/she has any questions or concerns regarding our treatment plan. No barriers to understanding were identified. We  discussed Red Flag symptoms and signs in detail. Patient expressed understanding regarding what to do in case of urgent or emergency type symptoms.  Medication list was reconciled, printed and provided to the patient in AVS. Patient instructions and summary information was reviewed with the patient as documented in the AVS. This note was prepared with assistance of Dragon voice recognition software. Occasional wrong-word or sound-a-like substitutions may have occurred due to the inherent limitations of voice recognition software

## 2023-01-22 NOTE — Progress Notes (Signed)
See my chart note.

## 2023-02-14 ENCOUNTER — Emergency Department (HOSPITAL_COMMUNITY): Payer: Commercial Managed Care - PPO

## 2023-02-14 ENCOUNTER — Emergency Department (HOSPITAL_COMMUNITY)
Admission: EM | Admit: 2023-02-14 | Discharge: 2023-02-15 | Disposition: A | Discharge status: Home / Self Care | Payer: Medicare Other | Attending: Emergency Medicine | Admitting: Emergency Medicine

## 2023-02-14 ENCOUNTER — Encounter (HOSPITAL_COMMUNITY): Payer: Self-pay

## 2023-02-14 ENCOUNTER — Other Ambulatory Visit: Payer: Self-pay

## 2023-02-14 DIAGNOSIS — I10 Essential (primary) hypertension: Secondary | ICD-10-CM | POA: Diagnosis not present

## 2023-02-14 DIAGNOSIS — W19XXXA Unspecified fall, initial encounter: Secondary | ICD-10-CM | POA: Diagnosis not present

## 2023-02-14 DIAGNOSIS — Y92000 Kitchen of unspecified non-institutional (private) residence as  the place of occurrence of the external cause: Secondary | ICD-10-CM | POA: Insufficient documentation

## 2023-02-14 DIAGNOSIS — Z79899 Other long term (current) drug therapy: Secondary | ICD-10-CM | POA: Diagnosis not present

## 2023-02-14 DIAGNOSIS — S0990XA Unspecified injury of head, initial encounter: Secondary | ICD-10-CM | POA: Diagnosis present

## 2023-02-14 DIAGNOSIS — F0390 Unspecified dementia without behavioral disturbance: Secondary | ICD-10-CM | POA: Insufficient documentation

## 2023-02-14 DIAGNOSIS — S0003XA Contusion of scalp, initial encounter: Secondary | ICD-10-CM | POA: Insufficient documentation

## 2023-02-14 NOTE — ED Notes (Signed)
Evaluated by Pollina MD. No additional orders obtained.

## 2023-02-14 NOTE — ED Triage Notes (Signed)
Pt presents via EMS c/o mechanical fall. Reports tripped over a suit case. Pt has hx of dementia. Alert and oriented to baseline per EMS. Fall witnessed by family. No LOC. No anticoagulation. Reports hit head. Small bump to back of head per EMS.

## 2023-02-14 NOTE — ED Provider Triage Note (Signed)
Emergency Medicine Provider Triage Evaluation Note  Rebecca Hubbard , a 82 y.o. female  was evaluated in triage.  Pt complains of headache after a fall.  Fall was witnessed by family.  Patient tripped over a suitcase, complaining of pain on the left side of her head.  No loss of consciousness  Review of Systems  Positive: Headache Negative: Neck pain  Physical Exam  BP (!) 152/70 (BP Location: Right Arm)   Pulse 65   Temp 98.1 F (36.7 C)   Resp 19   SpO2 100%  Gen:   Awake, no distress   Resp:  Normal effort  MSK:   Moves extremities without difficulty; no midline neck tenderness, no pain with range of motion of neck Other:  No neurologic deficit  Medical Decision Making  Medically screening exam initiated at 11:24 PM.  Appropriate orders placed.  Rebecca Hubbard was informed that the remainder of the evaluation will be completed by another provider, this initial triage assessment does not replace that evaluation, and the importance of remaining in the ED until their evaluation is complete.     Gilda Crease, MD 02/14/23 2325

## 2023-02-15 NOTE — ED Provider Notes (Signed)
[place] EMERGENCY DEPARTMENT AT [facility] Provider Note   CSN: [id number] Arrival date & time: 02/14/23  2311     History  Chief Complaint  Patient presents with   Rebecca Hubbard is a 82 y.o. female.  Patient presents to the emergency department via EMS with family at bedside with concern over a mechanical fall.  Patient was in the kitchen going for a snack when she tripped over a suitcase in the floor, falling and hitting the back of her head on the floor.  The patient did not experience a loss of consciousness according to family who witnessed the accident.  She is alert and oriented at baseline with a history of dementia.  Family was concerned due to a noted hematoma.  The patient does not take blood thinners.  Past medical history significant for frontotemporal dementia, arthritis, vertigo, GERD, hypertension   Fall       Home Medications Prior to Admission medications   Medication Sig Start Date End Date Taking? Authorizing Provider  albuterol (VENTOLIN HFA) 108 (90 Base) MCG/ACT inhaler Inhale 2 puffs into the lungs every 6 (six) hours as needed for wheezing or shortness of breath.  05/30/10   [provider]  Bepotastine Besilate 1.5 % SOLN Place 1 drop into both eyes daily as needed. 1.5 % 08/10/08   [provider]  betamethasone dipropionate 0.05 % cream Apply topically 2 (two) times daily as needed (Rash). 12/30/19   Clark-Burning, Victorino Dike, PA-C  calcium carbonate (OS-CAL) 600 MG TABS tablet Take 600 mg by mouth daily.    [provider]  celecoxib (CELEBREX) 200 MG capsule Take 200 mg by mouth daily. 08/14/22   [provider]  DULERA 200-5 MCG/ACT AERO USE 2 PUFFS WITH SPACER EVERY 12 HOURS TO PREVENT COUGH OR WHEEZE, RINSE, GARGLE AND SPIT AFTER USE 04/18/15   Kozlow, Alvira Philips, MD  escitalopram (LEXAPRO) 20 MG tablet Take 1 tablet (20 mg total) by mouth daily. 07/22/22   Willow Ora, MD  ezetimibe (ZETIA) 10  MG tablet TAKE 1 TABLET BY MOUTH EVERY DAY 01/13/23   Willow Ora, MD  fexofenadine (ALLEGRA) 180 MG tablet Take 180 mg by mouth as needed.    [provider]  ketoconazole (NIZORAL) 2 % cream APPLY TO AFFECTED AREA(S) 2 TIMES A DAY AS NEEDED FOR 7 DAYS 01/05/21   Willow Ora, MD  latanoprost (XALATAN) 0.005 % ophthalmic solution Place 1 drop into both eyes at bedtime. 10/06/13   [provider]  levothyroxine (SYNTHROID) 88 MCG tablet TAKE 1 TABLET BY MOUTH EVERY DAY 11/14/22   Willow Ora, MD  memantine (NAMENDA) 10 MG tablet TAKE 1 TABLET BY MOUTH TWICE A DAY 11/21/22   Butch Penny, NP  polyethylene glycol (MIRALAX / GLYCOLAX) 17 g packet Take 17 g by mouth as needed.    [provider]  rosuvastatin (CRESTOR) 5 MG tablet Take 1 tablet (5 mg total) by mouth at bedtime. 01/21/23   Willow Ora, MD  triamterene-hydrochlorothiazide [facility]) 37.5-25 MG tablet TAKE 1 TABLET BY MOUTH EVERY DAY 05/02/22   Willow Ora, MD      Allergies    Penicillins, Sulfa antibiotics, Aspirin, and Atorvastatin    Review of Systems   Review of Systems  Physical Exam Updated Vital Signs BP (!) 152/77   Pulse 62   Temp 98.1 F (36.7 C)   Resp 18   SpO2 98%  Physical Exam Vitals  and nursing note reviewed.  HENT:     Head: Normocephalic.     Comments: Small hematoma noted to left occipital portion of skull Eyes:     Pupils: Pupils are equal, round, and reactive to light.  Cardiovascular:     Rate and Rhythm: Normal rate.  Pulmonary:     Effort: Pulmonary effort is normal. No respiratory distress.  Musculoskeletal:        General: No signs of injury.     Cervical back: Normal range of motion.  Skin:    General: Skin is dry.  Neurological:     Mental Status: She is alert.     Gait: Gait normal.  Psychiatric:        Speech: Speech normal.        Behavior: Behavior normal.     ED Results / Procedures / Treatments   Labs (all labs ordered are  listed, but only abnormal results are displayed) Labs Reviewed - No data to display  EKG None  Radiology CT Head Wo Contrast  Result Date: 02/15/2023 CLINICAL DATA:  Headache, fall EXAM: CT HEAD WITHOUT CONTRAST TECHNIQUE: Contiguous axial images were obtained from the base of the skull through the vertex without intravenous contrast. RADIATION DOSE REDUCTION: This exam was performed according to the departmental dose-optimization program which includes automated exposure control, adjustment of the mA and/or kV according to patient size and/or use of iterative reconstruction technique. COMPARISON:  01/29/2014 FINDINGS: Brain: No acute intracranial abnormality. Specifically, no hemorrhage, hydrocephalus, mass lesion, acute infarction, or significant intracranial injury. There is atrophy and chronic small vessel disease changes. Vascular: No hyperdense vessel or unexpected calcification. Skull: No acute calvarial abnormality. Sinuses/Orbits: No acute findings Other: Soft tissue swelling in the posterior left parietal region. IMPRESSION: Atrophy, chronic microvascular disease. No acute intracranial abnormality. Electronically Signed   By: Charlett Nose M.D.   On: 02/15/2023 00:12    Procedures Procedures    Medications Ordered in ED Medications - No data to display  ED Course/ Medical Decision Making/ A&P                                 Medical Decision Making  Patient presents to the emergency department for evaluation after a mechanical fall.  Differential includes intracranial abnormality, fracture, soft tissue injury, others  I ordered and interpreted imaging including a CT scan of the head without contrast. Atrophy, chronic microvascular disease.    No acute intracranial abnormality.  I agree with the radiologist findings  Patient currently resting comfortably with family at bedside.  Family once again reiterates that patient is acting more alert than usual and at baseline.  They  state that the fall was witnessed as the patient tripped over a suitcase from a family member who just traveled back to town.  I see no indication at this time for further emergent workup.  Patient with small hematoma.  No active bleeding noted.  No abnormal findings on CT scan.  Discharge home with fall prevention education       Final Clinical Impression(s) / ED Diagnoses Final diagnoses:  Fall, initial encounter  Hematoma of scalp, initial encounter    Rx / DC Orders ED Discharge Orders     None         Pamala Duffel 02/15/23 0515    Tilden Fossa, MD 02/16/23 [phone]

## 2023-02-15 NOTE — ED Notes (Signed)
This RN reviewed discharge instructions with patient and family . Pt verbalized understanding and denied any further questions. PT well appearing upon discharge and reports tolerable pain. Pt wheeled out of emergency department and rode home with family. Pt. Ambulated to car with steady gait with cane device.

## 2023-02-15 NOTE — Discharge Instructions (Signed)
Your CT scan tonight was reassuring showing only chronic disease with no acute abnormalities.  If you develop any life-threatening symptoms please return to the emergency department.

## 2023-02-16 ENCOUNTER — Other Ambulatory Visit: Payer: Self-pay | Admitting: Family Medicine

## 2023-02-28 ENCOUNTER — Telehealth: Payer: Self-pay

## 2023-02-28 NOTE — Telephone Encounter (Signed)
Transition Care Management Unsuccessful Follow-up Telephone Call  Date of discharge and from where:  02/15/2023 [facility]  Attempts:  1st Attempt  Reason for unsuccessful TCM follow-up call:  Left voice message  Shyteria Lewis [facility]  [facility] Resource Care Guide Direct Dial: [phone]  Website: [email or web address]

## 2023-03-03 ENCOUNTER — Telehealth: Payer: Self-pay

## 2023-03-03 NOTE — Telephone Encounter (Signed)
Transition Care Management Unsuccessful Follow-up Telephone Call  Date of discharge and from where:  02/15/2023 [facility]  Attempts:  2nd Attempt  Reason for unsuccessful TCM follow-up call:  No answer/busy  Jontavious Commons [facility]  [facility] Resource Care Guide Direct Dial: [phone]  Website: [email or web address]

## 2023-03-25 ENCOUNTER — Ambulatory Visit: Payer: Medicare Other | Admitting: Podiatry

## 2023-04-01 ENCOUNTER — Telehealth: Payer: Medicare Other | Admitting: Adult Health

## 2023-04-01 DIAGNOSIS — G3109 Other frontotemporal dementia: Secondary | ICD-10-CM

## 2023-04-01 DIAGNOSIS — F0280 Dementia in other diseases classified elsewhere without behavioral disturbance: Secondary | ICD-10-CM | POA: Diagnosis not present

## 2023-04-01 MED ORDER — MEMANTINE HCL ER 28 MG PO CP24: 28.0000 mg | ORAL_CAPSULE | Freq: Every day | ORAL | 5 refills | Status: DC

## 2023-04-01 NOTE — Patient Instructions (Signed)
Your Plan:  Will switch to Namenda XR 28 mg daily.  If this is too costly we can go back to Namenda 10 mg twice a day     Thank you for coming to see [place] at [facility]. I hope we have been able to provide you high quality care today.  You may receive a patient satisfaction survey over the next few weeks. We would appreciate your feedback and comments so that we may continue to improve ourselves and the health of our patients.
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
## 2023-04-25 ENCOUNTER — Ambulatory Visit (INDEPENDENT_AMBULATORY_CARE_PROVIDER_SITE_OTHER): Payer: Medicare Other | Admitting: Podiatry

## 2023-04-25 ENCOUNTER — Encounter: Payer: Self-pay | Admitting: Podiatry

## 2023-04-25 DIAGNOSIS — Q828 Other specified congenital malformations of skin: Secondary | ICD-10-CM | POA: Diagnosis not present

## 2023-04-25 DIAGNOSIS — M7751 Other enthesopathy of right foot: Secondary | ICD-10-CM | POA: Diagnosis not present

## 2023-04-25 NOTE — Progress Notes (Signed)
 Subjective:  Patient ID: Rebecca Hubbard, female    DOB: January 21, 1941,  MRN: [id number]  Chief Complaint  Patient presents with   Toe Pain    4th toe right - corn x 3 months, very tender and dark, when she was younger a wagon ran over the toe, so always been deformed, tried soaking and trimming it   New Patient (Initial Visit)    83 y.o. female presents with the above complaint.  Patient presents with right fourth digit porokeratotic lesion painful to touch is progressive gotten worse worse with ambulation worse with pressure she states that the toe has been deformed for a while and hurts with ambulation she wanted to discuss treatment options for it.  Pain scale 7 out of 10 dull aching nature   Review of Systems: Negative except as noted in the HPI. Denies N/V/F/Ch.  Past Medical History:  Diagnosis Date   Arthritis    knees, cortisone injections   Asthma    Depression, recurrent (HCC) 05/25/2018   Sept  - January: since divorcing her husband many years ago.    Frontotemporal dementia (HCC) 06/15/2020   neurocog testing 05/2019   GERD (gastroesophageal reflux disease)    Glaucoma    Hyperlipidemia    Hypertension    Hypothyroidism    Seasonal allergies    Vertigo     Current Outpatient Medications:    albuterol  (VENTOLIN  HFA) 108 (90 Base) MCG/ACT inhaler, Inhale 2 puffs into the lungs every 6 (six) hours as needed for wheezing or shortness of breath. , Disp: , Rfl:    betamethasone  dipropionate 0.05 % cream, Apply topically 2 (two) times daily as needed (Rash)., Disp: 15 g, Rfl: 2   calcium  carbonate (OS-CAL) 600 MG TABS tablet, Take 600 mg by mouth daily., Disp: , Rfl:    celecoxib (CELEBREX) 200 MG capsule, Take 200 mg by mouth daily., Disp: , Rfl:    DULERA  200-5 MCG/ACT AERO, USE 2 PUFFS WITH SPACER EVERY 12 HOURS TO PREVENT COUGH OR WHEEZE, RINSE, GARGLE AND SPIT AFTER USE, Disp: 1 Inhaler, Rfl: 5   escitalopram  (LEXAPRO ) 20 MG tablet, Take 1 tablet (20 mg total) by  mouth daily., Disp: 90 tablet, Rfl: 3   ezetimibe  (ZETIA ) 10 MG tablet, TAKE 1 TABLET BY MOUTH EVERY DAY, Disp: 90 tablet, Rfl: 3   fexofenadine (ALLEGRA) 180 MG tablet, Take 180 mg by mouth as needed., Disp: , Rfl:    ketoconazole  (NIZORAL ) 2 % cream, APPLY TO AFFECTED AREA(S) 2 TIMES A DAY AS NEEDED FOR 7 DAYS, Disp: 30 g, Rfl: 0   latanoprost  (XALATAN ) 0.005 % ophthalmic solution, Place 1 drop into both eyes at bedtime., Disp: , Rfl:    levothyroxine  (SYNTHROID ) 88 MCG tablet, TAKE 1 TABLET BY MOUTH EVERY DAY, Disp: 90 tablet, Rfl: 3   memantine  (NAMENDA  XR) 28 MG CP24 24 hr capsule, Take 1 capsule (28 mg total) by mouth daily., Disp: 30 capsule, Rfl: 5   polyethylene glycol (MIRALAX  / GLYCOLAX ) 17 g packet, Take 17 g by mouth as needed., Disp: , Rfl:    rosuvastatin  (CRESTOR ) 5 MG tablet, Take 1 tablet (5 mg total) by mouth at bedtime., Disp: 90 tablet, Rfl: 3   triamterene -hydrochlorothiazide  (MAXZIDE -25) 37.5-25 MG tablet, TAKE 1 TABLET BY MOUTH EVERY DAY, Disp: 90 tablet, Rfl: 3  Social History   Tobacco Use  Smoking Status Former   Current packs/day: 0.00   Types: Cigarettes   Quit date: 1982   Years since quitting: 43.0  Smokeless  Tobacco Never    Allergies  Allergen Reactions   Penicillins Hives   Sulfa Antibiotics Other (See Comments), Anaphylaxis and Swelling    Other Reaction: Other reaction   Aspirin Rash   Atorvastatin Other (See Comments)    Cramps    Objective:  There were no vitals filed for this visit. There is no height or weight on file to calculate BMI. Constitutional Well developed. Well nourished.  Vascular Dorsalis pedis pulses palpable bilaterally. Posterior tibial pulses palpable bilaterally. Capillary refill normal to all digits.  No cyanosis or clubbing noted. Pedal hair growth normal.  Neurologic Normal speech. Oriented to person, place, and time. Epicritic sensation to light touch grossly present bilaterally.  Dermatologic Nails well groomed  and normal in appearance. No open wounds. No skin lesions.  Orthopedic: Pain on palpation of right fourth digit pain on palpation to the lesion.  Underlying capsulitis noted at the PIPJ joint   Radiographs: None Assessment:   1. Porokeratosis   2. Capsulitis of toe, right    Plan:  Patient was evaluated and treated and all questions answered.  Right fourth digit PIPJ capsulitis with underlying porokeratotic lesion -All questions and concerns were discussed with the patient extensive detail given the amount of pain that she is having she will benefit from steroid injection of decrease inflammatory component associate with pain.  Patient agrees with plan like to proceed with steroid injection -A steroid injection was performed at fourth PIPJ using 1% plain Lidocaine  and 10 mg of Kenalog. This was well tolerated. -Porokeratotic lesion was debrided down in standard technique no complication noted no pinpoint bleeding noted.   No follow-ups on file.

## 2023-05-02 ENCOUNTER — Other Ambulatory Visit: Payer: Self-pay | Admitting: Adult Health

## 2023-05-06 NOTE — Telephone Encounter (Signed)
Rx sent for 90 day supply. Ok per last appointment note, "Will switch to Namenda XR 28 mg daily."

## 2023-05-11 ENCOUNTER — Other Ambulatory Visit: Payer: Self-pay | Admitting: Family Medicine

## 2023-06-09 ENCOUNTER — Observation Stay (HOSPITAL_COMMUNITY): Payer: Medicare Other

## 2023-06-09 ENCOUNTER — Emergency Department (HOSPITAL_COMMUNITY): Payer: Medicare Other

## 2023-06-09 ENCOUNTER — Observation Stay (HOSPITAL_COMMUNITY)
Admission: EM | Admit: 2023-06-09 | Discharge: 2023-06-10 | Disposition: A | Discharge status: Home / Self Care | Payer: Medicare Other | Attending: Emergency Medicine | Admitting: Emergency Medicine

## 2023-06-09 ENCOUNTER — Encounter (HOSPITAL_COMMUNITY): Payer: Self-pay

## 2023-06-09 ENCOUNTER — Other Ambulatory Visit: Payer: Self-pay

## 2023-06-09 DIAGNOSIS — I350 Nonrheumatic aortic (valve) stenosis: Secondary | ICD-10-CM

## 2023-06-09 DIAGNOSIS — G3109 Other frontotemporal dementia: Secondary | ICD-10-CM | POA: Insufficient documentation

## 2023-06-09 DIAGNOSIS — E039 Hypothyroidism, unspecified: Secondary | ICD-10-CM | POA: Insufficient documentation

## 2023-06-09 DIAGNOSIS — R29810 Facial weakness: Secondary | ICD-10-CM | POA: Diagnosis not present

## 2023-06-09 DIAGNOSIS — J45909 Unspecified asthma, uncomplicated: Secondary | ICD-10-CM | POA: Insufficient documentation

## 2023-06-09 DIAGNOSIS — E119 Type 2 diabetes mellitus without complications: Secondary | ICD-10-CM | POA: Insufficient documentation

## 2023-06-09 DIAGNOSIS — G459 Transient cerebral ischemic attack, unspecified: Secondary | ICD-10-CM | POA: Diagnosis present

## 2023-06-09 DIAGNOSIS — Z87891 Personal history of nicotine dependence: Secondary | ICD-10-CM | POA: Diagnosis not present

## 2023-06-09 DIAGNOSIS — E876 Hypokalemia: Secondary | ICD-10-CM | POA: Insufficient documentation

## 2023-06-09 DIAGNOSIS — F0280 Dementia in other diseases classified elsewhere without behavioral disturbance: Secondary | ICD-10-CM | POA: Diagnosis not present

## 2023-06-09 DIAGNOSIS — C642 Malignant neoplasm of left kidney, except renal pelvis: Secondary | ICD-10-CM | POA: Diagnosis present

## 2023-06-09 DIAGNOSIS — I10 Essential (primary) hypertension: Secondary | ICD-10-CM | POA: Diagnosis not present

## 2023-06-09 DIAGNOSIS — Z85528 Personal history of other malignant neoplasm of kidney: Secondary | ICD-10-CM | POA: Insufficient documentation

## 2023-06-09 DIAGNOSIS — Z79899 Other long term (current) drug therapy: Secondary | ICD-10-CM | POA: Diagnosis not present

## 2023-06-09 DIAGNOSIS — Z7902 Long term (current) use of antithrombotics/antiplatelets: Secondary | ICD-10-CM | POA: Insufficient documentation

## 2023-06-09 DIAGNOSIS — R531 Weakness: Secondary | ICD-10-CM | POA: Diagnosis not present

## 2023-06-09 DIAGNOSIS — R29818 Other symptoms and signs involving the nervous system: Principal | ICD-10-CM | POA: Insufficient documentation

## 2023-06-09 DIAGNOSIS — R2990 Unspecified symptoms and signs involving the nervous system: Principal | ICD-10-CM

## 2023-06-09 DIAGNOSIS — Z1152 Encounter for screening for COVID-19: Secondary | ICD-10-CM | POA: Insufficient documentation

## 2023-06-09 DIAGNOSIS — F0390 Unspecified dementia without behavioral disturbance: Secondary | ICD-10-CM | POA: Insufficient documentation

## 2023-06-09 LAB — CBC WITH DIFFERENTIAL/PLATELET
Abs Immature Granulocytes: 0.02 10*3/uL (ref 0.00–0.07)
Basophils Absolute: 0 10*3/uL (ref 0.0–0.1)
Basophils Relative: 1 %
Eosinophils Absolute: 0.1 10*3/uL (ref 0.0–0.5)
Eosinophils Relative: 2 %
HCT: 36.6 % (ref 36.0–46.0)
Hemoglobin: 12.4 g/dL (ref 12.0–15.0)
Immature Granulocytes: 0 %
Lymphocytes Relative: 29 %
Lymphs Abs: 1.4 10*3/uL (ref 0.7–4.0)
MCH: 30.4 pg (ref 26.0–34.0)
MCHC: 33.9 g/dL (ref 30.0–36.0)
MCV: 89.7 fL (ref 80.0–100.0)
Monocytes Absolute: 0.5 10*3/uL (ref 0.1–1.0)
Monocytes Relative: 11 %
Neutro Abs: 2.6 10*3/uL (ref 1.7–7.7)
Neutrophils Relative %: 57 %
Platelets: 200 10*3/uL (ref 150–400)
RBC: 4.08 MIL/uL (ref 3.87–5.11)
RDW: 13 % (ref 11.5–15.5)
WBC: 4.6 10*3/uL (ref 4.0–10.5)
nRBC: 0 % (ref 0.0–0.2)

## 2023-06-09 LAB — URINALYSIS, W/ REFLEX TO CULTURE (INFECTION SUSPECTED)
Bacteria, UA: NONE SEEN
Bilirubin Urine: NEGATIVE
Glucose, UA: NEGATIVE mg/dL
Hgb urine dipstick: NEGATIVE
Ketones, ur: NEGATIVE mg/dL
Leukocytes,Ua: NEGATIVE
Nitrite: NEGATIVE
Protein, ur: NEGATIVE mg/dL
Specific Gravity, Urine: >1.046 — ABNORMAL HIGH (ref 1.005–1.030)
pH: 7 (ref 5.0–8.0)

## 2023-06-09 LAB — I-STAT CHEM 8, ED
BUN: 9 mg/dL (ref 8–23)
Calcium, Ion: 0.86 mmol/L — CL (ref 1.15–1.40)
Chloride: 103 mmol/L (ref 98–111)
Creatinine, Ser: 0.9 mg/dL (ref 0.44–1.00)
Glucose, Bld: 88 mg/dL (ref 70–99)
HCT: 37 % (ref 36.0–46.0)
Hemoglobin: 12.6 g/dL (ref 12.0–15.0)
Potassium: 3.5 mmol/L (ref 3.5–5.1)
Sodium: 136 mmol/L (ref 135–145)
TCO2: 26 mmol/L (ref 22–32)

## 2023-06-09 LAB — COMPREHENSIVE METABOLIC PANEL
ALT: 12 U/L (ref 0–44)
AST: 17 U/L (ref 15–41)
Albumin: 3.1 g/dL — ABNORMAL LOW (ref 3.5–5.0)
Alkaline Phosphatase: 96 U/L (ref 38–126)
Anion gap: 13 (ref 5–15)
BUN: 9 mg/dL (ref 8–23)
CO2: 24 mmol/L (ref 22–32)
Calcium: 8.4 mg/dL — ABNORMAL LOW (ref 8.9–10.3)
Chloride: 101 mmol/L (ref 98–111)
Creatinine, Ser: 0.92 mg/dL (ref 0.44–1.00)
GFR, Estimated: >60 mL/min (ref >60)
Glucose, Bld: 88 mg/dL (ref 70–99)
Potassium: 3.4 mmol/L — ABNORMAL LOW (ref 3.5–5.1)
Sodium: 138 mmol/L (ref 135–145)
Total Bilirubin: 1 mg/dL (ref 0.0–1.2)
Total Protein: 6.4 g/dL — ABNORMAL LOW (ref 6.5–8.1)

## 2023-06-09 LAB — PROTIME-INR
INR: 1.3 — ABNORMAL HIGH (ref 0.8–1.2)
Prothrombin Time: 15.9 s — ABNORMAL HIGH (ref 11.4–15.2)

## 2023-06-09 LAB — T4, FREE: Free T4: 0.88 ng/dL (ref 0.61–1.12)

## 2023-06-09 LAB — APTT: aPTT: 27 s (ref 24–36)

## 2023-06-09 LAB — RESP PANEL BY RT-PCR (RSV, FLU A&B, COVID)  RVPGX2
Influenza A by PCR: NEGATIVE
Influenza B by PCR: NEGATIVE
Resp Syncytial Virus by PCR: NEGATIVE
SARS Coronavirus 2 by RT PCR: NEGATIVE

## 2023-06-09 LAB — CBG MONITORING, ED: Glucose-Capillary: 97 mg/dL (ref 70–99)

## 2023-06-09 LAB — TSH: TSH: 4.999 u[IU]/mL — ABNORMAL HIGH (ref 0.350–4.500)

## 2023-06-09 LAB — TROPONIN I (HIGH SENSITIVITY)
Troponin I (High Sensitivity): 3 ng/L (ref <18)
Troponin I (High Sensitivity): 5 ng/L (ref <18)

## 2023-06-09 LAB — MAGNESIUM: Magnesium: 2 mg/dL (ref 1.7–2.4)

## 2023-06-09 LAB — ETHANOL: Alcohol, Ethyl (B): <10 mg/dL (ref <10)

## 2023-06-09 MED ORDER — STROKE: EARLY STAGES OF RECOVERY BOOK: Freq: Once | Status: DC

## 2023-06-09 MED ORDER — EZETIMIBE 10 MG PO TABS
10.0000 mg | ORAL_TABLET | Freq: Every day | ORAL | Status: DC
  Filled 2023-06-09: qty 1

## 2023-06-09 MED ORDER — ACETAMINOPHEN 650 MG RE SUPP: 650.0000 mg | RECTAL | Status: DC | PRN

## 2023-06-09 MED ORDER — SENNOSIDES-DOCUSATE SODIUM 8.6-50 MG PO TABS: 1.0000 | ORAL_TABLET | Freq: Every evening | ORAL | Status: DC | PRN

## 2023-06-09 MED ORDER — GADOBUTROL 1 MMOL/ML IV SOLN: 8.0000 mL | Freq: Once | INTRAVENOUS | Status: DC | PRN

## 2023-06-09 MED ORDER — LEVOTHYROXINE SODIUM 88 MCG PO TABS
88.0000 ug | ORAL_TABLET | Freq: Every day | ORAL | Status: DC
  Administered 2023-06-10: 88 ug via ORAL
  Filled 2023-06-09: qty 1

## 2023-06-09 MED ORDER — ROSUVASTATIN CALCIUM 5 MG PO TABS
5.0000 mg | ORAL_TABLET | Freq: Every day | ORAL | Status: DC
  Administered 2023-06-10: 5 mg via ORAL
  Filled 2023-06-09: qty 1

## 2023-06-09 MED ORDER — HEPARIN SODIUM (PORCINE) 5000 UNIT/ML IJ SOLN
5000.0000 [IU] | Freq: Three times a day (TID) | INTRAMUSCULAR | Status: DC
  Administered 2023-06-10: 5000 [IU] via SUBCUTANEOUS
  Filled 2023-06-09: qty 1

## 2023-06-09 MED ORDER — ACETAMINOPHEN 325 MG PO TABS: 650.0000 mg | ORAL_TABLET | ORAL | Status: DC | PRN

## 2023-06-09 MED ORDER — LATANOPROST 0.005 % OP SOLN
1.0000 [drp] | Freq: Every day | OPHTHALMIC | Status: DC
Start: 2023-06-09 — End: 2023-06-10
  Administered 2023-06-10: 1 [drp] via OPHTHALMIC
  Filled 2023-06-09: qty 2.5

## 2023-06-09 MED ORDER — ESCITALOPRAM OXALATE 10 MG PO TABS
20.0000 mg | ORAL_TABLET | Freq: Every day | ORAL | Status: DC
  Filled 2023-06-09: qty 2

## 2023-06-09 MED ORDER — GADOBUTROL 1 MMOL/ML IV SOLN
8.0000 mL | Freq: Once | INTRAVENOUS | Status: AC | PRN
  Administered 2023-06-09: 8 mL via INTRAVENOUS

## 2023-06-09 MED ORDER — IOHEXOL 350 MG/ML SOLN
75.0000 mL | Freq: Once | INTRAVENOUS | Status: AC | PRN
  Administered 2023-06-09: 75 mL via INTRAVENOUS

## 2023-06-09 MED ORDER — LACTATED RINGERS IV BOLUS
500.0000 mL | Freq: Once | INTRAVENOUS | Status: AC
  Administered 2023-06-09: 500 mL via INTRAVENOUS

## 2023-06-09 MED ORDER — POTASSIUM CHLORIDE 20 MEQ PO PACK
40.0000 meq | PACK | Freq: Once | ORAL | Status: AC
  Administered 2023-06-09: 40 meq via ORAL
  Filled 2023-06-09: qty 2

## 2023-06-09 MED ORDER — MEMANTINE HCL ER 28 MG PO CP24
28.0000 mg | ORAL_CAPSULE | Freq: Every day | ORAL | Status: DC
  Filled 2023-06-09: qty 1

## 2023-06-09 MED ORDER — CLOPIDOGREL BISULFATE 75 MG PO TABS
75.0000 mg | ORAL_TABLET | Freq: Every day | ORAL | Status: DC
  Administered 2023-06-09: 75 mg via ORAL
  Filled 2023-06-09 (×2): qty 1

## 2023-06-09 MED ORDER — ACETAMINOPHEN 160 MG/5ML PO SOLN: 650.0000 mg | ORAL | Status: DC | PRN

## 2023-06-09 MED ORDER — SODIUM CHLORIDE 0.9% FLUSH
3.0000 mL | Freq: Once | INTRAVENOUS | Status: AC
  Administered 2023-06-09: 3 mL via INTRAVENOUS

## 2023-06-09 NOTE — ED Triage Notes (Addendum)
 Pt bib [facility] coming from home with c/o slow speech and left sided facial droop. LWK around 1400 today. EMS reports pt daughter went to go check on pt and noticed she was not acting like herself, with slow speech and left sided facial droop. EMS states pt does not have any other deficits at this time. Pt able to follow commands, has hx of Alzheimer's. Pt reports chest tightness to EMS.   EMS VS: 172/70 58-60 HR w/ PACs 99% RA 191 cbg

## 2023-06-09 NOTE — ED Provider Notes (Signed)
 [place] EMERGENCY DEPARTMENT AT [facility] Provider Note  Arrival date/time:06/09/2023 7:25 PM  HPI/ROS   Rebecca Hubbard is a 83 y.o. female with PMH significant for HTN, HLD, hypothyroidism, cognitive impairment, renal malignant neoplasm who presents for code stroke History is provided by EMS and daughter.  Patient was reportedly in normal state of health around 1 PM today.  However daughter noticed that she had slowed speech and what appeared to be left-sided weakness left-sided facial droop.  Patient endorsed to daughter that she was not feeling well and so daughter called EMS due to concern for stroke.  On arrival, patient endorses that she still feels strange.  She does endorse some mild chest discomfort.  She is oriented to self. Daughter is at bedside to provide additional history.  She endorses that patient has been in her normal state of health recently.  She is normally independent with most ADLs except for cooking, which the daughter does for safety reasons.  She is ambulatory independently and only uses a chairlift to get up and down the stairs.  She endorsed that patient speech is very slowed right now and she is usually "a talker."  A complete ROS was performed with pertinent positives/negatives noted above.   ED Course and Medical Decision Making   I personally reviewed the patient's vitals.  Assessment/Plan: This is a 83 year old patient who is presenting with concern for strokelike symptoms of left-sided weakness and facial droop. On arrival, her symptoms have resolved. Neurology is at bedside and reassessed patient together.  Point-of-care sugars within normal limits. Patient was brought for CT head without contrast which did not show any obvious intracranial hemorrhage.  Her metabolic workup is reassuring, CBC shows no leukocytosis or anemia. CMP shows mild hypokalemia to 3.4.  Respiratory panel is negative for COVID, RSV and flu.  Ethanol is  undetectable. Troponin was obtained due to patient complaint of chest pain and is within normal limits at 5.  On conversation with neurology, patient will be admitted for stroke/TIA workup given concerning symptoms now with resolution.   Disposition:  I discussed the case with hospitalist team who graciously agreed to admit the patient to their service for continued care.   Clinical Impression:  1. Stroke-like symptoms     Rx / DC Orders ED Discharge Orders     None       The plan for this patient was discussed with Dr. Jeraldine Loots, who voiced agreement and who oversaw evaluation and treatment of this patient.   Clinical Complexity A medically appropriate history, review of systems, and physical exam was performed.  Patient's presentation is most consistent with acute presentation with potential threat to life or bodily function.  Medical Decision Making Amount and/or Complexity of Data Reviewed Labs: ordered. Radiology: ordered.  Risk Prescription drug management. Decision regarding hospitalization.    Physical Exam and Medical History   Vitals:   06/09/23 1630 06/09/23 1632 06/09/23 1645 06/09/23 1708  BP: (!) 186/81 (!) 186/81 (!) 176/79   Pulse: (!) 51 (!) 56 (!) 54   Resp: 16 19 19    Temp:  97.6 F (36.4 C)    TempSrc:  Oral    SpO2: 100% 100% 100%   Weight:      Height:    4\' 10"  (1.473 m)    Physical Exam Vitals and nursing note reviewed.  Constitutional:      General: She is not in acute distress.    Appearance: She is well-developed.  HENT:  Head: Normocephalic and atraumatic.  Eyes:     Conjunctiva/sclera: Conjunctivae normal.  Cardiovascular:     Rate and Rhythm: Normal rate and regular rhythm.     Heart sounds: No murmur heard. Pulmonary:     Effort: Pulmonary effort is normal. No respiratory distress.     Breath sounds: Normal breath sounds.  Abdominal:     Palpations: Abdomen is soft.     Tenderness: There is no abdominal  tenderness.  Musculoskeletal:        General: No swelling.     Cervical back: Neck supple.  Skin:    General: Skin is warm and dry.     Capillary Refill: Capillary refill takes less than 2 seconds.  Neurological:     Mental Status: She is alert.     Comments: Oriented to self only, which is reportedly baseline. She does have slowed speech, which is abnormal per daughter. She has no obvious weakness on either side and no obvious facial droop.  Psychiatric:        Mood and Affect: Mood normal.     Medical History: Allergies  Allergen Reactions   Penicillins Hives   Sulfa Antibiotics Other (See Comments), Anaphylaxis and Swelling    Other Reaction: Other reaction   Aspirin Rash   Atorvastatin Other (See Comments)    Cramps    Past Medical History:  Diagnosis Date   Arthritis    knees, cortisone injections   Asthma    Depression, recurrent (HCC) 05/25/2018   Sept  - January: since divorcing her husband many years ago.    Frontotemporal dementia (HCC) 06/15/2020   neurocog testing 05/2019   GERD (gastroesophageal reflux disease)    Glaucoma    Hyperlipidemia    Hypertension    Hypothyroidism    Seasonal allergies    Vertigo     Past Surgical History:  Procedure Laterality Date   ABDOMINAL HYSTERECTOMY     MULTIPLE TOOTH EXTRACTIONS     top    THYROIDECTOMY     Family History  Problem Relation Age of Onset   Stroke Mother    Alzheimer's disease Father    Cancer Sister     Social History   Tobacco Use   Smoking status: Former    Current packs/day: 0.00    Types: Cigarettes    Quit date: 1982    Years since quitting: 43.1   Smokeless tobacco: Never  Vaping Use   Vaping status: Never Used  Substance Use Topics   Alcohol use: Not Currently    Alcohol/week: 5.0 standard drinks of alcohol    Types: 5 Cans of beer per week    Comment: seltzers and margaritas   Drug use: No    Procedures   If procedures were preformed on this patient, they are listed  below:  Procedures   -------- HPI and MDM generated using voice dictation software and may contain dictation errors. Please contact me for any clarification or with any questions.   Cephus Slater, MD Emergency Medicine PGY-2    Caron Presume, MD 06/09/23 Epimenio Foot    Gerhard Munch, MD 06/09/23 [phone]

## 2023-06-09 NOTE — ED Notes (Signed)
 Patient transported to MRI

## 2023-06-09 NOTE — H&P (Addendum)
 History and Physical    Patient: Rebecca Hubbard ZOX:[id number] DOB: 08-12-1940 DOA: 06/09/2023 DOS: the patient was seen and examined on 06/09/2023 PCP: Willow Ora, MD  Patient coming from: Home  Chief Complaint:  Chief Complaint  Patient presents with   Code Stroke   HPI: Rebecca Hubbard is a 83 y.o. female with medical history significant renal tumor being monitored, hypertension, AS, hyperlipidemia, hypothyroidism and dementia.  The patient lives with her daughter who is a Engineer, civil (consulting) and she works from home.  The daughter reports that at about 2:00 PM the patient came to her and said she was not feeling well the daughter noticed immediately that her left arm was weak, left eye was closed and she was unable to open it and she had a prominent droop on the left side of her mouth. Her speech was slurred and she was slightly more confused than usual. EMS was called and they did note the same on neurologic symptoms.  By this time the patient dived in the emergency department her symptoms had resolved. A code stroke protocol was followed.  The patient was seen quickly and had a CT scan of her head followed by an MRI of the brain. Those results are not available but the patient is back to baseline and will be admitted for full stroke workup.   Review of Systems: unable to review all systems due to the inability of the patient to answer questions. Past Medical History:  Diagnosis Date   Arthritis    knees, cortisone injections   Asthma    Depression, recurrent (HCC) 05/25/2018   Sept  - January: since divorcing her husband many years ago.    Frontotemporal dementia (HCC) 06/15/2020   neurocog testing 05/2019   GERD (gastroesophageal reflux disease)    Glaucoma    Hyperlipidemia    Hypertension    Hypothyroidism    Seasonal allergies    Vertigo    Past Surgical History:  Procedure Laterality Date   ABDOMINAL HYSTERECTOMY     MULTIPLE TOOTH EXTRACTIONS     top     THYROIDECTOMY     Social History:  reports that she quit smoking about 43 years ago. Her smoking use included cigarettes. She has never used smokeless tobacco. She reports that she does not currently use alcohol after a past usage of about 5.0 standard drinks of alcohol per week. She reports that she does not use drugs.  Allergies  Allergen Reactions   Penicillins Hives   Sulfa Antibiotics Other (See Comments), Anaphylaxis and Swelling    Other Reaction: Other reaction   Aspirin Rash   Atorvastatin Other (See Comments)    Cramps     Family History  Problem Relation Age of Onset   Stroke Mother    Alzheimer's disease Father    Cancer Sister     Prior to Admission medications   Medication Sig Start Date End Date Taking? Authorizing Provider  albuterol (VENTOLIN HFA) 108 (90 Base) MCG/ACT inhaler Inhale 2 puffs into the lungs every 6 (six) hours as needed for wheezing or shortness of breath.  05/30/10   [provider]  betamethasone dipropionate 0.05 % cream Apply topically 2 (two) times daily as needed (Rash). 12/30/19   Clark-Burning, Victorino Dike, PA-C  calcium carbonate (OS-CAL) 600 MG TABS tablet Take 600 mg by mouth daily.    [provider]  celecoxib (CELEBREX) 200 MG capsule Take 200 mg by mouth daily. 08/14/22   [provider]  Elwin Sleight  200-5 MCG/ACT AERO USE 2 PUFFS WITH SPACER EVERY 12 HOURS TO PREVENT COUGH OR WHEEZE, RINSE, GARGLE AND SPIT AFTER USE 04/18/15   Kozlow, Alvira Philips, MD  escitalopram (LEXAPRO) 20 MG tablet Take 1 tablet (20 mg total) by mouth daily. 07/22/22   Willow Ora, MD  ezetimibe (ZETIA) 10 MG tablet TAKE 1 TABLET BY MOUTH EVERY DAY 01/13/23   Willow Ora, MD  fexofenadine (ALLEGRA) 180 MG tablet Take 180 mg by mouth as needed.    [provider]  ketoconazole (NIZORAL) 2 % cream APPLY TO AFFECTED AREA(S) 2 TIMES A DAY AS NEEDED FOR 7 DAYS 01/05/21   Willow Ora, MD  latanoprost (XALATAN) 0.005 % ophthalmic solution Place 1  drop into both eyes at bedtime. 10/06/13   [provider]  levothyroxine (SYNTHROID) 88 MCG tablet TAKE 1 TABLET BY MOUTH EVERY DAY 11/14/22   Willow Ora, MD  memantine (NAMENDA XR) 28 MG CP24 24 hr capsule TAKE 1 CAPSULE BY MOUTH DAILY. 05/06/23   Butch Penny, NP  polyethylene glycol (MIRALAX / GLYCOLAX) 17 g packet Take 17 g by mouth as needed.    [provider]  rosuvastatin (CRESTOR) 5 MG tablet Take 1 tablet (5 mg total) by mouth at bedtime. 01/21/23   Willow Ora, MD  triamterene-hydrochlorothiazide [facility]) 37.5-25 MG tablet TAKE 1 TABLET BY MOUTH EVERY DAY 05/12/23   Willow Ora, MD    Physical Exam: Vitals:   06/09/23 1708 06/09/23 1930 06/09/23 2000 06/09/23 2015  BP:  (!) 173/85 (!) 161/76 (!) 169/71  Pulse:  63 60 63  Resp:  15 16 (!) 21  Temp:      TempSrc:      SpO2:  100% 100% 100%  Weight:      Height: 4\' 10"  (1.473 m)      Physical Exam:  General: No acute distress, well developed, well nourished HEENT: Normocephalic, atraumatic, PERRL Cardiovascular: bradycardic, Normal rhythm. SM, Distal pulses intact, L>R Pulmonary: Normal pulmonary effort, normal breath sounds Gastrointestinal: Nondistended abdomen, soft, non-tender, normoactive bowel sounds Musculoskeletal:Normal ROM, no lower ext edema Skin: Skin is warm and dry. Neuro: No focal deficits noted, Alert  PSYCH: Attentive and cooperative  Data Reviewed:  Results for orders placed or performed during the hospital encounter of 06/09/23 (from the past 24 hours)  CBG monitoring, ED     Status: None   Collection Time: 06/09/23  4:13 PM  Result Value Ref Range   Glucose-Capillary 97 70 - 99 mg/dL  Comprehensive metabolic panel     Status: Abnormal   Collection Time: 06/09/23  4:35 PM  Result Value Ref Range   Sodium 138 135 - 145 mmol/L   Potassium 3.4 (L) 3.5 - 5.1 mmol/L   Chloride 101 98 - 111 mmol/L   CO2 24 22 - 32 mmol/L   Glucose, Bld 88 70 - 99 mg/dL   BUN 9 8 - 23  mg/dL   Creatinine, Ser 9.14 0.44 - 1.00 mg/dL   Calcium 8.4 (L) 8.9 - 10.3 mg/dL   Total Protein 6.4 (L) 6.5 - 8.1 g/dL   Albumin 3.1 (L) 3.5 - 5.0 g/dL   AST 17 15 - 41 U/L   ALT 12 0 - 44 U/L   Alkaline Phosphatase 96 38 - 126 U/L   Total Bilirubin 1.0 0.0 - 1.2 mg/dL   GFR, Estimated >78 >29 mL/min   Anion gap 13 5 - 15  Ethanol     Status: None  Collection Time: 06/09/23  4:35 PM  Result Value Ref Range   Alcohol, Ethyl (B) <10 <10 mg/dL  Magnesium     Status: None   Collection Time: 06/09/23  4:35 PM  Result Value Ref Range   Magnesium 2.0 1.7 - 2.4 mg/dL  TSH     Status: Abnormal   Collection Time: 06/09/23  4:35 PM  Result Value Ref Range   TSH 4.999 (H) 0.350 - 4.500 uIU/mL  T4, free     Status: None   Collection Time: 06/09/23  4:35 PM  Result Value Ref Range   Free T4 0.88 0.61 - 1.12 ng/dL  Troponin I (High Sensitivity)     Status: None   Collection Time: 06/09/23  4:35 PM  Result Value Ref Range   Troponin I (High Sensitivity) 3 <18 ng/L  Resp panel by RT-PCR (RSV, Flu A&B, Covid) Anterior Nasal Swab     Status: None   Collection Time: 06/09/23  4:37 PM   Specimen: Anterior Nasal Swab  Result Value Ref Range   SARS Coronavirus 2 by RT PCR NEGATIVE NEGATIVE   Influenza A by PCR NEGATIVE NEGATIVE   Influenza B by PCR NEGATIVE NEGATIVE   Resp Syncytial Virus by PCR NEGATIVE NEGATIVE  I-stat chem 8, ED     Status: Abnormal   Collection Time: 06/09/23  4:42 PM  Result Value Ref Range   Sodium 136 135 - 145 mmol/L   Potassium 3.5 3.5 - 5.1 mmol/L   Chloride 103 98 - 111 mmol/L   BUN 9 8 - 23 mg/dL   Creatinine, Ser 1.61 0.44 - 1.00 mg/dL   Glucose, Bld 88 70 - 99 mg/dL   Calcium, Ion 0.96 (LL) 1.15 - 1.40 mmol/L   TCO2 26 22 - 32 mmol/L   Hemoglobin 12.6 12.0 - 15.0 g/dL   HCT 04.5 40.9 - 81.1 %   Comment NOTIFIED PHYSICIAN   CBC with Differential/Platelet     Status: None   Collection Time: 06/09/23  5:20 PM  Result Value Ref Range   WBC 4.6 4.0 - 10.5  K/uL   RBC 4.08 3.87 - 5.11 MIL/uL   Hemoglobin 12.4 12.0 - 15.0 g/dL   HCT 91.4 78.2 - 95.6 %   MCV 89.7 80.0 - 100.0 fL   MCH 30.4 26.0 - 34.0 pg   MCHC 33.9 30.0 - 36.0 g/dL   RDW 21.3 08.6 - 57.8 %   Platelets 200 150 - 400 K/uL   nRBC 0.0 0.0 - 0.2 %   Neutrophils Relative % 57 %   Neutro Abs 2.6 1.7 - 7.7 K/uL   Lymphocytes Relative 29 %   Lymphs Abs 1.4 0.7 - 4.0 K/uL   Monocytes Relative 11 %   Monocytes Absolute 0.5 0.1 - 1.0 K/uL   Eosinophils Relative 2 %   Eosinophils Absolute 0.1 0.0 - 0.5 K/uL   Basophils Relative 1 %   Basophils Absolute 0.0 0.0 - 0.1 K/uL   Immature Granulocytes 0 %   Abs Immature Granulocytes 0.02 0.00 - 0.07 K/uL  Protime-INR     Status: Abnormal   Collection Time: 06/09/23  6:00 PM  Result Value Ref Range   Prothrombin Time 15.9 (H) 11.4 - 15.2 seconds   INR 1.3 (H) 0.8 - 1.2  APTT     Status: None   Collection Time: 06/09/23  6:00 PM  Result Value Ref Range   aPTT 27 24 - 36 seconds  Troponin I (High Sensitivity)  Status: None   Collection Time: 06/09/23  6:05 PM  Result Value Ref Range   Troponin I (High Sensitivity) 5 <18 ng/L     Assessment and Plan: Tia vs Stoke - All symptoms have resolved. Code stoke called and neurology has evaluated. Neurology recommendations as follows:   - Admit for stroke workup - Permissive HTN x48 hrs from sx onset or until stroke ruled out by MRI goal BP <220/110. PRN labetalol or hydralazine if BP above these parameters. Avoid oral antihypertensives. - MRI brain wo contrast - CTA if baseline creatinine within normal limits, MRA and carotid ultrasound otherwise - TTE w/ bubble - Check A1c and LDL + add statin per guidelines - antiplt/anticoag to be determined after MRI, please note that patient has aspirin allergy - q4 hr neuro checks - STAT head CT for any change in neuro exam - Tele - PT/OT/SLP - Stroke education - Amb referral to neurology upon discharge    -The patient's daughter is a  Engineer, civil (consulting).  The patient symptoms of left-sided facial droop and left-sided weakness were quite dramatic and the patient is not able to take aspirin because of excessive bruising.  She does like the idea of the patient being on Plavix for prevention.  Will start that  2. Hypokalemia -replete.  The patient does take a diuretic at baseline.  3. Htn -will hold the patient's diuretic medication while we allow permissive hypertension.  4. Dementia - She is calm and pleasant.  Monitor for delirium.   5. Hypothyroidism - TSH is slightly elevated. She is under replaced.  Advance Care Planning:   Code Status: Full Code the patient's daughter with whom she lives is her healthcare power of attorney.  She reports the patient is full code at this time.  Consults: neurology  Family Communication: Daughter at bedside.  Severity of Illness: The appropriate patient status for this patient is OBSERVATION. Observation status is judged to be reasonable and necessary in order to provide the required intensity of service to ensure the patient's safety. The patient's presenting symptoms, physical exam findings, and initial radiographic and laboratory data in the context of their medical condition is felt to place them at decreased risk for further clinical deterioration. Furthermore, it is anticipated that the patient will be medically stable for discharge from the hospital within 2 midnights of admission.   Author: Buena Irish, MD 06/09/2023 8:33 PM  For on call review [email or web address].

## 2023-06-09 NOTE — Consult Note (Addendum)
**Note Rebecca-Identified via Obfuscation**  NEUROLOGY CONSULT NOTE   Date of service: June 09, 2023 Patient Name: Rebecca Hubbard MRN:  [id number] DOB:  June 16, 1940 Chief Complaint: "Left-sided facial droop and slow, slurred speech with left-sided weakness" Requesting Provider: Gerhard Munch, Rebecca Hubbard  History of Present Illness  Rebecca Hubbard is a 83 y.o. female with hx of frontotemporal dementia, renal cancer, hyperlipidemia, hypertension, hypothyroidism and LPRD who presents with a transient episode of left-sided weakness, left facial droop and slow, slurred speech.  Patient has a diagnosis of frontotemporal dementia and lives at home with her daughter.  She is able to manage her own ADLs, but daughter cooks, drives and handles her medications.  She does have a left-sided renal tumor but is not receiving active treatment for this.  Patient had been complaining of feeling nonspecifically unwell and tired for the past few days.  Today, she seemed to be in her normal state of health at around 1400.  When her daughter came to check on her 30 minutes later, she complained of some tightness and pain in her chest, stated she was nonspecifically unwell and had left-sided weakness, left facial droop and slow, slurred speech.  Deficits had improved by the time EMS arrived, and exam on arrival to the hospital was nonfocal.  LKW: 1400 Modified rankin score: 2-Slight disability-UNABLE to perform all activities but does not need assistance IV Thrombolysis: No, symptoms resolved EVT: No, deficits resolved  NIHSS components Score: Comment  1a Level of Conscious 0[x]  1[]  2[]  3[]      1b LOC Questions 0[x]  1[]  2[]       1c LOC Commands 0[x]  1[]  2[]       2 Best Gaze 0[x]  1[]  2[]       3 Visual 0[x]  1[]  2[]  3[]      4 Facial Palsy 0[x]  1[]  2[]  3[]      5a Motor Arm - left 0[x]  1[]  2[]  3[]  4[]  UN[]    5b Motor Arm - Right 0[x]  1[]  2[]  3[]  4[]  UN[]    6a Motor Leg - Left 0[]  1[]  2[x]  3[]  4[]  UN[]    6b Motor Leg - Right 0[]  1[]  2[x]  3[]  4[]  UN[]     7 Limb Ataxia 0[x]  1[]  2[]  3[]  UN[]     8 Sensory 0[x]  1[]  2[]  UN[]      9 Best Language 0[x]  1[]  2[]  3[]      10 Dysarthria 0[x]  1[]  2[]  UN[]      11 Extinct. and Inattention 0[x]  1[]  2[]       TOTAL:4       ROS   Unable to ascertain due to altered mental status, baseline dementia  Past History   Past Medical History:  Diagnosis Date   Arthritis    knees, cortisone injections   Asthma    Depression, recurrent (HCC) 05/25/2018   Sept  - January: since divorcing her husband many years ago.    Frontotemporal dementia (HCC) 06/15/2020   neurocog testing 05/2019   GERD (gastroesophageal reflux disease)    Glaucoma    Hyperlipidemia    Hypertension    Hypothyroidism    Seasonal allergies    Vertigo     Past Surgical History:  Procedure Laterality Date   ABDOMINAL HYSTERECTOMY     MULTIPLE TOOTH EXTRACTIONS     top    THYROIDECTOMY      Family History: Family History  Problem Relation Age of Onset   Stroke Mother    Alzheimer's disease Father    Cancer Sister     Social History  reports that she quit smoking  about 43 years ago. Her smoking use included cigarettes. She has never used smokeless tobacco. She reports that she does not currently use alcohol after a past usage of about 5.0 standard drinks of alcohol per week. She reports that she does not use drugs.  Allergies  Allergen Reactions   Penicillins Hives   Sulfa Antibiotics Other (See Comments), Anaphylaxis and Swelling    Other Reaction: Other reaction   Aspirin Rash   Atorvastatin Other (See Comments)    Cramps     Medications   Current Facility-Administered Medications:    [START ON 06/10/2023]  stroke: early stages of recovery book, , Does not apply, Once, Rebecca Hubbard, Rebecca E, NP   lactated ringers bolus 500 mL, 500 mL, Intravenous, Once, Rebecca Presume, Rebecca Hubbard   sodium chloride flush (NS) 0.9 % injection 3 mL, 3 mL, Intravenous, Once, Rebecca Munch, Rebecca Hubbard  Current Outpatient Medications:     albuterol (VENTOLIN HFA) 108 (90 Base) MCG/ACT inhaler, Inhale 2 puffs into the lungs every 6 (six) hours as needed for wheezing or shortness of breath. , Disp: , Rfl:    betamethasone dipropionate 0.05 % cream, Apply topically 2 (two) times daily as needed (Rash)., Disp: 15 g, Rfl: 2   calcium carbonate (OS-CAL) 600 MG TABS tablet, Take 600 mg by mouth daily., Disp: , Rfl:    celecoxib (CELEBREX) 200 MG capsule, Take 200 mg by mouth daily., Disp: , Rfl:    DULERA 200-5 MCG/ACT AERO, USE 2 PUFFS WITH SPACER EVERY 12 HOURS TO PREVENT COUGH OR WHEEZE, RINSE, GARGLE AND SPIT AFTER USE, Disp: 1 Inhaler, Rfl: 5   escitalopram (LEXAPRO) 20 MG tablet, Take 1 tablet (20 mg total) by mouth daily., Disp: 90 tablet, Rfl: 3   ezetimibe (ZETIA) 10 MG tablet, TAKE 1 TABLET BY MOUTH EVERY DAY, Disp: 90 tablet, Rfl: 3   fexofenadine (ALLEGRA) 180 MG tablet, Take 180 mg by mouth as needed., Disp: , Rfl:    ketoconazole (NIZORAL) 2 % cream, APPLY TO AFFECTED AREA(S) 2 TIMES A DAY AS NEEDED FOR 7 DAYS, Disp: 30 g, Rfl: 0   latanoprost (XALATAN) 0.005 % ophthalmic solution, Place 1 drop into both eyes at bedtime., Disp: , Rfl:    levothyroxine (SYNTHROID) 88 MCG tablet, TAKE 1 TABLET BY MOUTH EVERY DAY, Disp: 90 tablet, Rfl: 3   memantine (NAMENDA XR) 28 MG CP24 24 hr capsule, TAKE 1 CAPSULE BY MOUTH DAILY., Disp: 90 capsule, Rfl: 2   polyethylene glycol (MIRALAX / GLYCOLAX) 17 g packet, Take 17 g by mouth as needed., Disp: , Rfl:    rosuvastatin (CRESTOR) 5 MG tablet, Take 1 tablet (5 mg total) by mouth at bedtime., Disp: 90 tablet, Rfl: 3   triamterene-hydrochlorothiazide (MAXZIDE-25) 37.5-25 MG tablet, TAKE 1 TABLET BY MOUTH EVERY DAY, Disp: 90 tablet, Rfl: 3  Vitals   Vitals:   06/09/23 1600 06/09/23 1632  BP:  (!) 186/81  Pulse:  (!) 56  Resp:  19  Temp:  97.6 F (36.4 C)  TempSrc:  Oral  SpO2:  100%  Weight: 82 kg     Body mass index is 35.31 kg/m.  Physical Exam   Constitutional: Appears  well-developed and well-nourished.  Psych: Affect appropriate to situation.  Eyes: No scleral injection.  HENT: No OP obstruction.  Head: Normocephalic.  Cardiovascular: Normal rate and regular rhythm with occasional PACs Respiratory: Effort normal, non-labored breathing.  Skin: WDI.   Neurologic Examination    NEURO:  Mental Status: Alert and oriented  to person, place, able to state correct month but not able to give history of current illness Speech/Language: speech is without dysarthria or aphasia.  Per family, she is speaking less than she normally does  Cranial Nerves:  II: PERRL. Visual fields full.  III, IV, VI: EOMI. Eyelids elevate symmetrically.  VII: Smile is symmetrical.  VIII: hearing intact to voice. IX, X: Phonation is normal.  XII: tongue is midline without fasciculations. Motor: Able to move all 4 extremities with good antigravity strength, but bilateral lower extremities drift back down to the bed Tone: is normal and bulk is normal Sensation- Intact to light touch bilaterally.  Coordination: FTN intact bilaterally, HKS: Unable to perform Gait- deferred   Labs/Imaging/Neurodiagnostic studies   CBC: No results for input(s): "WBC", "NEUTROABS", "HGB", "HCT", "MCV", "PLT" in the last 168 hours. Basic Metabolic Panel:  Lab Results  Component Value Date   NA 137 01/21/2023   K 3.5 01/21/2023   CO2 29 01/21/2023   GLUCOSE 95 01/21/2023   BUN 12 01/21/2023   CREATININE 0.88 01/21/2023   CALCIUM 8.8 01/21/2023   GFRNONAA 46 (L) 08/22/2021   GFRAA 84 12/07/2019   Lipid Panel:  Lab Results  Component Value Date   LDLCALC 83 01/21/2023   HgbA1c: No results found for: "HGBA1C" Urine Drug Screen: No results found for: "LABOPIA", "COCAINSCRNUR", "LABBENZ", "AMPHETMU", "THCU", "LABBARB"  Alcohol Level No results found for: "ETH" INR  Lab Results  Component Value Date   INR 1.20 01/29/2014   APTT  Lab Results  Component Value Date   APTT 30 01/29/2014    AED levels: No results found for: "PHENYTOIN", "ZONISAMIDE", "LAMOTRIGINE", "LEVETIRACETA"  CT Head without contrast(Personally reviewed): No acute abnormality, chronic infarct in right thalamus, anterior temporal lobe atrophy  CT angio Head and Neck with contrast(Personally reviewed): Pending, awaiting creatinine before ordering vessel imaging  MRI Brain with and without contrast (Personally reviewed): Pending  Resting EEG:  Pending  ASSESSMENT   Bridney Guadarrama is a 83 y.o. female with history of frontotemporal dementia, renal cancer, hyperlipidemia, hypertension, hypothyroidism and LPRD who presents with a transient episode of left-sided weakness, left facial droop and slow, slurred speech.  Deficits had improved by the time EMS arrived and had largely resolved by the time she arrived to the hospital.  Will proceed with brain MRI with and without contrast and stroke/TIA workup.  Given age, risk factors and chronic stroke seen on CT head, she is  at risk for having another stroke or TIA.     RECOMMENDATIONS  Stroke/TIA Workup  - Admit for stroke workup - Permissive HTN x48 hrs from sx onset or until stroke ruled out by MRI goal BP <220/110. PRN labetalol or hydralazine if BP above these parameters. Avoid oral antihypertensives. - MRI brain wo contrast - TTE w/ bubble - Check A1c and LDL + add statin per guidelines - antiplt/anticoag to be determined after MRI, please note that patient has aspirin allergy - q4 hr neuro checks - STAT head CT for any change in neuro exam - Tele - PT/OT/SLP - Stroke education - Amb referral to neurology upon discharge   ______________________________________________________________________  Patient seen by NP with MD, Rebecca Hubbard to edit note as needed.  Signed, Rebecca Hubbard Ernestina Columbia, NP [facility]   ATTENDING ATTESTATION:  Came in as code stroke. Emergently evaluated at ED bridge. Stat CT neg.  Low likelihood of stroke. MRI  neg. Likely related to infectious of metabolic encephalopathy.  Back to her baseline.  Discussed with her daughter.   No need for further workup.  Neurology will sign off.  Can f/u with Primary outpt.    Dr. Viviann Spare evaluated pt independently, reviewed imaging, chart, labs. Discussed and formulated plan with the Resident/APP. Changes were made to the note where appropriate. Please see APP/resident note above for details.     MDM: High. Pertinent labs, imaging results reviewed by me and considered in my decision making. Independently reviewed imaging. Medical records reviewed. Discussed the patient with another medical provider/personnel. Obtained history from someone other than the patient.      Rebecca Hemric,Rebecca Hubbard

## 2023-06-09 NOTE — Code Documentation (Signed)
    Rebecca Hubbard is an 83 yr old female with a PMH of dementia arriving to [facility] via EMS on 06/09/2023. Pt is coming from home where she was LKW today at 1400, and is now complaining of left sided weakness and "slow speech". She is not on any blood thinners.    CBG obtained at bridge, and airway cleared by EDP. Pt to CT with team. NIHSS 5. Pt with mild L facial weakness and bilateral drift in legs. The following imaging was obtained: CT. Per Dr. Viviann Spare, CT is negative for hemorrhage.     Pt back to ED room 19 where her workup will continue. She will need q 30 min NIHSS and BP until she is OOW (18:30), then q 2 hr. Please notify stroke team for any worsening. Pt is currently not a candidate for thrombolytics as she is too mild, and she is ineligible for NIR as she has no LVO signs. Bedside handoff with ED RN complete.

## 2023-06-10 ENCOUNTER — Observation Stay (HOSPITAL_BASED_OUTPATIENT_CLINIC_OR_DEPARTMENT_OTHER): Payer: Medicare Other

## 2023-06-10 DIAGNOSIS — G459 Transient cerebral ischemic attack, unspecified: Secondary | ICD-10-CM

## 2023-06-10 DIAGNOSIS — R29818 Other symptoms and signs involving the nervous system: Secondary | ICD-10-CM | POA: Diagnosis not present

## 2023-06-10 LAB — LIPID PANEL
Cholesterol: 168 mg/dL (ref 0–200)
HDL: 47 mg/dL (ref >40)
LDL Cholesterol: 106 mg/dL — ABNORMAL HIGH (ref 0–99)
Total CHOL/HDL Ratio: 3.6 ratio
Triglycerides: 75 mg/dL (ref <150)
VLDL: 15 mg/dL (ref 0–40)

## 2023-06-10 LAB — ECHOCARDIOGRAM COMPLETE
AR max vel: 0.92 cm2
AV Area VTI: 0.87 cm2
AV Area mean vel: 0.85 cm2
AV Mean grad: 11.2 mmHg
AV Peak grad: 19.3 mmHg
Ao pk vel: 2.2 m/s
Area-P 1/2: 2.32 cm2
Height: 58 in
S' Lateral: 3.1 cm
Weight: 2892.44 [oz_av]

## 2023-06-10 MED ORDER — CALCIUM CARBONATE 1250 (500 CA) MG PO TABS: 1.0000 | ORAL_TABLET | Freq: Two times a day (BID) | ORAL | Status: DC

## 2023-06-10 MED ORDER — SENNOSIDES-DOCUSATE SODIUM 8.6-50 MG PO TABS: 1.0000 | ORAL_TABLET | Freq: Every evening | ORAL | 1 refills | Status: DC | PRN

## 2023-06-10 MED ORDER — CLOPIDOGREL BISULFATE 75 MG PO TABS
75.0000 mg | ORAL_TABLET | Freq: Every day | ORAL | 1 refills | Status: DC
Start: 2023-06-10 — End: 2023-09-09

## 2023-06-10 MED ORDER — POLYETHYLENE GLYCOL 3350 17 G PO PACK: 17.0000 g | PACK | Freq: Every day | ORAL | Status: DC

## 2023-06-10 NOTE — Discharge Summary (Signed)
 Physician Discharge Summary  Patient ID: Rebecca Hubbard MRN: [id number] DOB/AGE: [birth year] 83 y.o.  Admit date: 06/09/2023 Discharge date: 06/10/2023  Admission Diagnoses:  Discharge Diagnoses:  Principal Problem:   TIA (transient ischemic attack) Active Problems:   Essential hypertension   Aortic stenosis, mild   Renal malignant neoplasm, left [facility])   Discharged Condition: stable  Hospital Course: Patient is an 83 year old female past medical history significant for hypertension, aortic stenosis, hyperlipidemia, dementia, hypothyroidism and history of renal tumor that is currently being monitored.  Patient was admitted with left-sided weakness, facial droop and slurred speech.  Symptoms resolved en route to the hospital.  On presentation to the hospital, neurology team was consulted to assist with patient's management.  Transient ischemic attack:  -On presentation to the hospital, all symptoms had resolved. -Code stoke called and neurology team was consulted.   -Neurology team assisted in directing patient's care.   - MRI brain did not reveal any acute findings.   - CTA head and neck is as documented below.   -Transthoracic echo did not reveal significant finding.   -Patient will be discharged back, on Plavix. -Patient will follow-up with primary care provider and neurology team on discharge.  Please note: The patient's daughter is a Engineer, civil (consulting).  The patient symptoms of left-sided facial droop and left-sided weakness were quite dramatic and the patient is not able to take aspirin because of excessive bruising.  She does like the idea of the patient being on Plavix for prevention.     Hypokalemia: -Repleted. -PCP to continue to monitor electrolytes.    Hypertension: Dementia: Hypothyroidism:  Advance Care Planning:   Code Status: Full Code the patient's daughter with whom she lives is her healthcare power of attorney.  She reports the patient is full code at this time.     Consults: neurology  Significant Diagnostic Studies:  CT ANGIOGRAPHY HEAD AND NECK WITH AND WITHOUT CONTRAST:   TECHNIQUE: Multidetector CT imaging of the head and neck was performed using the standard protocol during bolus administration of intravenous contrast. Multiplanar CT image reconstructions and MIPs were obtained to evaluate the vascular anatomy. Carotid stenosis measurements (when applicable) are obtained utilizing NASCET criteria, using the distal internal carotid diameter as the denominator.   RADIATION DOSE REDUCTION: This exam was performed according to the departmental dose-optimization program which includes automated exposure control, adjustment of the mA and/or kV according to patient size and/or use of iterative reconstruction technique.   CONTRAST:  75mL OMNIPAQUE IOHEXOL 350 MG/ML SOLN   COMPARISON:  CT head and MR head without contrast 06/09/2023.   FINDINGS: CTA NECK FINDINGS   Aortic arch: Dense atherosclerotic calcifications are present at the aortic arch great vessel origins without focal stenosis. Common origin of the left common carotid artery and innominate artery is noted. No aneurysm or dissection is present.   Right carotid system: The right common carotid artery is mildly tortuous without focal stenosis. Calcifications are present scratched at atherosclerotic calcifications are present at the right carotid bifurcation without significant stenosis relative to the more distal vessel. Mild irregularity is present in the distal cervical right ICA without focal stenosis or dissection.   Left carotid system: The left common carotid artery is within normal limits. Minimal atherosclerotic changes are present at the bifurcation without focal stenosis. The cervical left ICA is within normal limits. Mild irregularity is present the distal cervical left ICA just below the skull base without focal stenosis or dissection.   Vertebral arteries: The  vertebral artery  code dominant. Both vertebral arteries originate from the subclavian arteries without focal stenosis. No significant stenosis is present in either vertebral artery in the neck.   Skeleton: Vertebral body heights alignment are normal. Multilevel endplate sclerotic changes are present, worse on the right. No focal osseous lesions are present. The patient is edentulous.   Other neck: Soft tissues the neck are otherwise unremarkable. Salivary glands are within normal limits. Thyroid is normal. No significant adenopathy is present. No focal mucosal or submucosal lesions are present.   Upper chest: The lung apices are clear. The thoracic inlet is within normal limits. The esophagus is mildly dilated.   Review of the MIP images confirms the above findings   CTA HEAD FINDINGS   Anterior circulation: Atherosclerotic calcifications are present within the cavernous internal carotid arteries bilaterally. Moderate supraclinoid stenosis is present on the right. An irregular right posterior communicating artery aneurysm measures 3 x 4 mm. The ICA termini are within normal limits bilaterally. The ACA and MCA branch vessels are within normal limits. No other aneurysms are present.   Posterior circulation: PICA origins are visualized and normal. The vertebrobasilar junction basilar artery normal. The superior cerebellar arteries are patent bilaterally. Both posterior cerebral arteries originate from basilar tip. The PCA branch vessels are normal bilaterally.   Venous sinuses: The study was completed in early arterial phase and the dural sinuses are not adequately opacified for assessment.   Anatomic variants: None   Review of the MIP images confirms the above findings   IMPRESSION: 1. 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 2. Moderate supraclinoid stenosis of the right internal carotid artery. 3. Mild irregularity of the distal cervical internal  carotid arteries bilaterally suggests fibromuscular dysplasia. 4. No other significant proximal stenosis, aneurysm, or branch vessel occlusion within the Circle of Willis. 5. Atherosclerotic changes at the carotid bifurcations bilaterally without significant stenosis relative to the more distal vessels. 6.  Aortic Atherosclerosis (ICD10-I70.0).     Electronically Signed   By: Marin Roberts M.D.   On: 06/09/2023 21:50    MRI HEAD WITHOUT AND WITH CONTRAST:   TECHNIQUE: Multiplanar, multiecho pulse sequences of the brain and surrounding structures were obtained without and with intravenous contrast.   CONTRAST:  8mL GADAVIST GADOBUTROL 1 MMOL/ML IV SOLN   COMPARISON:  CT head without contrast 06/09/2023. MR head without and with contrast 12/09/2019   FINDINGS: Brain: Prominent temporal lobe atrophy is again noted. Frontal and parietal lobes demonstrate more normal age related atrophy. Mild periventricular white matter changes demonstrates some progression, within normal limits for age.   No acute infarct, hemorrhage, or mass lesion is present. The ventricles are proportionate to the degree of atrophy. No significant extraaxial fluid collection is present. Deep brain nuclei are within normal limits. Remote lacunar infarcts are present within the thalami bilaterally. White matter changes extend into the brainstem. Remote lacunar infarct is present in the inferior left cerebellum. Cerebellum is otherwise within normal limits. The internal auditory canals are within normal limits.   Enlarged relatively empty sella is again noted. Midline structures are otherwise unremarkable.   Vascular: Flow is present in the major intracranial arteries.   Skull and upper cervical spine: Moderate degenerative changes are present in the upper cervical spine with central canal stenosis at C3-4 and C4-5. Progressive endplate marrow changes are present at C3-4. Marrow signal is  otherwise normal.   Sinuses/Orbits: The paranasal sinuses and mastoid air cells are clear. Bilateral lens replacements are noted. Globes and  orbits are otherwise unremarkable.   IMPRESSION: 1. No acute intracranial abnormality or significant interval change. 2. Prominent temporal lobe atrophy is again noted. This can be seen in the setting of Alzheimer's disease. 3. Mild periventricular white matter disease demonstrates some progression, within normal limits for age. This likely reflects the sequela of chronic microvascular ischemia. 4. Remote lacunar infarcts of the thalami bilaterally and inferior left cerebellum. 5. Moderate degenerative changes in the upper cervical spine with central canal stenosis at C3-4 and C4-5.     Electronically Signed   By: Marin Roberts M.D.   On: 06/09/2023 21:26     Echo revealed: 1. Left ventricular ejection fraction, by estimation, is 70 to 75%. The  left ventricle has hyperdynamic function. The left ventricle has no  regional wall motion abnormalities. There is mild concentric left  ventricular hypertrophy. Left ventricular  diastolic parameters are consistent with Grade I diastolic dysfunction  (impaired relaxation).   2. Right ventricular systolic function is normal. The right ventricular  size is normal.   3. Left atrial size was mildly dilated.   4. The mitral valve is normal in structure. Trivial mitral valve  regurgitation. No evidence of mitral stenosis. Moderate mitral annular  calcification.   5. The aortic valve is tricuspid. There is severe calcifcation of the  aortic valve. Aortic valve regurgitation is not visualized. Moderate to  severe aortic valve stenosis. Aortic valve area, by VTI measures 0.87 cm.  Aortic valve mean gradient measures  11.2 mmHg. Aortic valve Vmax measures 2.20 m/s.   6. The inferior vena cava is normal in size with greater than 50%  respiratory variability, suggesting right atrial pressure of 3  mmHg.     Treatments:   Discharge Exam: Blood pressure (!) 154/68, pulse (!) 58, temperature 98.1 F (36.7 C), temperature source Oral, resp. rate (!) 26, height 4\' 10"  (1.473 m), weight 82 kg, SpO2 100%.   Disposition: Discharge disposition: 01-Home or Self Care       Discharge Instructions     Diet - low sodium heart healthy   Complete by: As directed    Diet - low sodium heart healthy   Complete by: As directed    Increase activity slowly   Complete by: As directed    Increase activity slowly   Complete by: As directed       Allergies as of 06/10/2023       Reactions   Penicillins Hives   Sulfa Antibiotics Other (See Comments), Anaphylaxis, Swelling   Other Reaction: Other reaction   Aspirin Rash   Atorvastatin Other (See Comments)   Cramps        Medication List     TAKE these medications    albuterol 108 (90 Base) MCG/ACT inhaler Commonly known as: VENTOLIN HFA Inhale 2 puffs into the lungs every 6 (six) hours as needed for wheezing or shortness of breath.   calcium carbonate 600 MG Tabs tablet Commonly known as: OS-CAL Take 600 mg by mouth daily.   clopidogrel 75 MG tablet Commonly known as: PLAVIX Take 1 tablet (75 mg total) by mouth daily.   Dulera 200-5 MCG/ACT Aero Generic drug: mometasone-formoterol USE 2 PUFFS WITH SPACER EVERY 12 HOURS TO PREVENT COUGH OR WHEEZE, RINSE, GARGLE AND SPIT AFTER USE   escitalopram 20 MG tablet Commonly known as: LEXAPRO Take 1 tablet (20 mg total) by mouth daily.   ezetimibe 10 MG tablet Commonly known as: ZETIA TAKE 1 TABLET BY MOUTH EVERY DAY  fexofenadine 180 MG tablet Commonly known as: ALLEGRA Take 180 mg by mouth as needed for allergies.   latanoprost 0.005 % ophthalmic solution Commonly known as: XALATAN Place 1 drop into both eyes at bedtime.   levothyroxine 88 MCG tablet Commonly known as: SYNTHROID TAKE 1 TABLET BY MOUTH EVERY DAY   memantine 28 MG Cp24 24 hr capsule Commonly  known as: NAMENDA XR TAKE 1 CAPSULE BY MOUTH DAILY.   polyethylene glycol 17 g packet Commonly known as: MIRALAX / GLYCOLAX Take 17 g by mouth as needed for moderate constipation.   rosuvastatin 5 MG tablet Commonly known as: CRESTOR Take 1 tablet (5 mg total) by mouth at bedtime.   senna-docusate 8.6-50 MG tablet Commonly known as: Senokot-S Take 1 tablet by mouth at bedtime as needed for moderate constipation.   triamterene-hydrochlorothiazide 37.5-25 MG tablet Commonly known as: MAXZIDE-25 TAKE 1 TABLET BY MOUTH EVERY DAY       Time spent: 35 minutes.  SignedBarnetta Chapel 06/10/2023, 11:44 AM

## 2023-06-10 NOTE — Progress Notes (Signed)
*  PRELIMINARY RESULTS* Echocardiogram 2D Echocardiogram has been performed.  Rebecca Hubbard 06/10/2023, 8:57 AM

## 2023-06-10 NOTE — Progress Notes (Addendum)
 Transition of Care [facility]) - Inpatient Brief Assessment   Patient Details  Name: Rebecca Hubbard MRN: [id number] Date of Birth: [birth year]  Transition of Care [facility]) CM/SW Contact:    Oletta Cohn, RN Phone Number: 06/10/2023, 8:45 AM   Clinical Narrative: RNCM  met with pt and daughter at bedside regarding discharge planning.  Pt utilizes mobility devices (stair lift, cane, walker) but is otherwise independent of ADLs.  Plan to return home with home health PT.   Transition of Care Asessment: Insurance and Status: Insurance coverage has been reviewed Patient has primary care physician: Yes Home environment has been reviewed: from home with daughter Prior level of function:: independent of most ADLs (has mobility aids) Prior/Current Home Services: No current home services Social Drivers of Health Review: SDOH reviewed no interventions necessary Readmission risk has been reviewed: Yes Transition of care needs: transition of care needs identified, TOC will continue to follow

## 2023-06-10 NOTE — TOC CM/SW Note (Signed)
 Rebecca Hubbard J. Lucretia Roers, RN, BSN, [place] [phone] RNCM spoke with pt and daughter at bedside regarding discharge planning for Home Health Services. Offered pt medicare.gov list of home health agencies to choose from.  Pt chose [facility] to render PTservices. Artayvia  of [facility] notified. Patient made aware that [facility] will be in contact in 24-48 hours.  No DME needs identified at this time.

## 2023-06-10 NOTE — Evaluation (Addendum)
 Physical Therapy Evaluation Patient Details Name: Rebecca Hubbard MRN: [id number] DOB: 05/14/1940 Today's Date: 06/10/2023  History of Present Illness  83 y.o. female with medical history significant renal tumor being monitored, hypertension, AS, hyperlipidemia, hypothyroidism and dementia.  The patient lives with her daughter who is a Engineer, civil (consulting) and she works from home.  Patient presented 2/24 with Left arm weakness, L facila droop and increased confusion over baseline.  Symptoms have resolved and daughter stating patient is at her baseline.  MR Brain:No acute intracranial abnormality or significant interval change.  2. Prominent temporal lobe atrophy is again noted.   Clinical Impression  PT eval complete. PTA pt mod I household mobility/ADLs, living at home with her daughter. On eval, she demo mod I bed mobility. Supervision transfers, and CGA amb 150' without AD. Plan is for d/c home today. Recommend HHPT. No DME needs. PT signing off.         If plan is discharge home, recommend the following: Help with stairs or ramp for entrance;Assistance with cooking/housework;Assist for transportation;Supervision due to cognitive status   Can travel by private vehicle        Equipment Recommendations None recommended by PT  Recommendations for Other Services       Functional Status Assessment Patient has had a recent decline in their functional status and demonstrates the ability to make significant improvements in function in a reasonable and predictable amount of time.     Precautions / Restrictions Precautions Precautions: Other (comment) Precaution/Restrictions Comments: Generalized supervision due to baseline dementia      Mobility  Bed Mobility Overal bed mobility: Modified Independent             General bed mobility comments: increased time    Transfers Overall transfer level: Needs assistance Equipment used: None Transfers: Sit to/from Stand Sit to Stand:  Supervision                Ambulation/Gait Ambulation/Gait assistance: Contact guard assist Gait Distance (Feet): 150 Feet Assistive device: None Gait Pattern/deviations: Step-through pattern, Decreased stride length Gait velocity: WFL Gait velocity interpretation: 1.31 - 2.62 ft/sec, indicative of limited community ambulator   General Gait Details: mildly unsteady, but no LOB  Stairs            Wheelchair Mobility     Tilt Bed    Modified Rankin (Stroke Patients Only) Modified Rankin (Stroke Patients Only) Pre-Morbid Rankin Score: Moderate disability Modified Rankin: Moderate disability     Balance Overall balance assessment: Mild deficits observed, not formally tested                                           Pertinent Vitals/Pain Pain Assessment Pain Assessment: No/denies pain    Home Living Family/patient expects to be discharged to:: Private residence Living Arrangements: Children Available Help at Discharge: Family;Available 24 hours/day Type of Home: House Home Access: Stairs to enter   Entrance Stairs-Number of Steps: 1 Alternate Level Stairs-Number of Steps: Stair lift for upstairs access. Home Layout: Two level;Bed/bath upstairs Home Equipment: Rolling Walker (2 wheels);Wheelchair - Government social research officer - single point Additional Comments: Uses RW for outside mobility, and wheelchair for longer distances.    Prior Function Prior Level of Function : Needs assist             Mobility Comments: amb in house without AD, RW vs cane vs w/c in  community ADLs Comments: mod I basic ADLs. Daugther assists with meals, iADLs, and driving     Extremity/Trunk Assessment   Upper Extremity Assessment Upper Extremity Assessment: Overall WFL for tasks assessed    Lower Extremity Assessment Lower Extremity Assessment: Overall WFL for tasks assessed (symmetrical)    Cervical / Trunk Assessment Cervical / Trunk Assessment:  Normal  Communication   Communication Communication: No apparent difficulties    Cognition Arousal: Alert Behavior During Therapy: WFL for tasks assessed/performed   PT - Cognitive impairments: History of cognitive impairments                       PT - Cognition Comments: Alzheimer's at baseline. Very pleasant. Following commands: Intact       Cueing Cueing Techniques: Verbal cues     General Comments      Exercises     Assessment/Plan    PT Assessment All further PT needs can be met in the next venue of care  PT Problem List Decreased balance;Decreased mobility;Decreased activity tolerance       PT Treatment Interventions      PT Goals (Current goals can be found in the Care Plan section)  Acute Rehab PT Goals Patient Stated Goal: home PT Goal Formulation: All assessment and education complete, DC therapy    Frequency       Co-evaluation               AM-PAC PT "6 Clicks" Mobility  Outcome Measure Help needed turning from your back to your side while in a flat bed without using bedrails?: None Help needed moving from lying on your back to sitting on the side of a flat bed without using bedrails?: None Help needed moving to and from a bed to a chair (including a wheelchair)?: A Little Help needed standing up from a chair using your arms (e.g., wheelchair or bedside chair)?: A Little Help needed to walk in hospital room?: A Little Help needed climbing 3-5 steps with a railing? : A Lot 6 Click Score: 19    End of Session Equipment Utilized During Treatment: Gait belt Activity Tolerance: Patient tolerated treatment well Patient left: in bed;with call bell/phone within reach;with family/visitor present Nurse Communication: Mobility status PT Visit Diagnosis: Difficulty in walking, not elsewhere classified (R26.2)    Time: 1610-9604 PT Time Calculation (min) (ACUTE ONLY): 13 min   Charges:   PT Evaluation $PT Eval Low Complexity: 1 Low    PT General Charges $$ ACUTE PT VISIT: 1 Visit         Ferd Glassing., PT  Office # [phone]   Ilda Foil 06/10/2023, 10:48 AM

## 2023-06-10 NOTE — Evaluation (Signed)
 Occupational Therapy Evaluation Patient Details Name: Rebecca Hubbard MRN: [id number] DOB: Feb 28, 1941 Today's Date: 06/10/2023   History of Present Illness   83 y.o. female with medical history significant renal tumor being monitored, hypertension, AS, hyperlipidemia, hypothyroidism and dementia.  The patient lives with her daughter who is a Engineer, civil (consulting) and she works from home.  Patient presented 2/24 with Left arm weakness, L facila droop and increased confusion over baseline.  Symptoms have resolved and daughter stating patient is at her baseline.  MR Brain:No acute intracranial abnormality or significant interval change.  2. Prominent temporal lobe atrophy is again noted.     Clinical Impressions Patient admitted for the diagnosis above.  PTA she lives at home with her daughter, who works from home and provides any needed assist.  Daughter endorses that patient is back to baseline, and that admitting symptoms have resolved.  No further OT needs in the acute setting, the daughter would like HH PT to address declines to activity tolerance, lower body strength, balance and maintenance of mobility in the home.  PT Eval is pending.       If plan is discharge home, recommend the following:   Assistance with cooking/housework;Assist for transportation;Direct supervision/assist for medications management;Supervision due to cognitive status     Functional Status Assessment   Patient has not had a recent decline in their functional status     Equipment Recommendations   None recommended by OT     Recommendations for Other Services         Precautions/Restrictions   Precautions Precautions: Other (comment) Precaution/Restrictions Comments: Generalized supervision due to baseline dementia Restrictions Weight Bearing Restrictions Per Provider Order: No     Mobility Bed Mobility Overal bed mobility: Modified Independent                  Transfers Overall transfer  level: Modified independent Equipment used: None                      Balance Overall balance assessment: Mild deficits observed, not formally tested                                         ADL either performed or assessed with clinical judgement   ADL Overall ADL's : At baseline                                             Vision Baseline Vision/History: 1 Wears glasses Patient Visual Report: No change from baseline       Perception Perception: Within Functional Limits       Praxis Praxis: WFL       Pertinent Vitals/Pain Pain Assessment Pain Assessment: Faces Faces Pain Scale: No hurt Pain Intervention(s): Monitored during session     Extremity/Trunk Assessment Upper Extremity Assessment Upper Extremity Assessment: Overall WFL for tasks assessed       Cervical / Trunk Assessment Cervical / Trunk Assessment: Normal   Communication Communication Communication: No apparent difficulties   Cognition Arousal: Alert Behavior During Therapy: WFL for tasks assessed/performed Cognition: History of cognitive impairments             OT - Cognition Comments: Poor memory  Following commands: Intact       Cueing  General Comments   Cueing Techniques: Verbal cues   VSS on RA   Exercises     Shoulder Instructions      Home Living Family/patient expects to be discharged to:: Private residence Living Arrangements: Children Available Help at Discharge: Family;Available 24 hours/day Type of Home: House Home Access: Stairs to enter [employer] of Steps: 1   Home Layout: Two level;Bed/bath upstairs Alternate Level Stairs-Number of Steps: Stair lift for upstairs access.   Bathroom Shower/Tub: Tub/shower unit;Walk-in shower   Bathroom Toilet: Standard Bathroom Accessibility: Yes How Accessible: Accessible via walker Home Equipment: Rolling Walker (2 wheels);Wheelchair -  Sport and exercise psychologist Comments: Uses RW for outside mobility, and wheelchair for longer distances.      Prior Functioning/Environment Prior Level of Function : Needs assist               ADLs Comments: Patient cares for her own ADL.  Daughter assists with meals, iADLs, community mobility and medications.    OT Problem List: Decreased activity tolerance   OT Treatment/Interventions:        OT Goals(Current goals can be found in the care plan section)   Acute Rehab OT Goals Patient Stated Goal: Return home OT Goal Formulation: With patient Time For Goal Achievement: 06/16/23 Potential to Achieve Goals: Good   OT Frequency:       Co-evaluation              AM-PAC OT "6 Clicks" Daily Activity     Outcome Measure Help from another person eating meals?: None Help from another person taking care of personal grooming?: None Help from another person toileting, which includes using toliet, bedpan, or urinal?: A Little Help from another person bathing (including washing, rinsing, drying)?: A Little Help from another person to put on and taking off regular upper body clothing?: None Help from another person to put on and taking off regular lower body clothing?: A Little 6 Click Score: 21   End of Session Equipment Utilized During Treatment: Gait belt Nurse Communication: Mobility status  Activity Tolerance: Patient tolerated treatment well Patient left: in bed;with call bell/phone within reach;with family/visitor present  OT Visit Diagnosis: Unsteadiness on feet (R26.81)                Time: 6213-0865 OT Time Calculation (min): 24 min Charges:  OT General Charges $OT Visit: 1 Visit OT Evaluation $OT Eval Moderate Complexity: 1 Mod OT Treatments $Self Care/Home Management : 8-22 mins  06/10/2023  RP, OTR/L  Acute Rehabilitation Services  Office:  [phone]   Suzanna Obey 06/10/2023, 9:42 AM

## 2023-06-10 NOTE — ED Notes (Signed)
 Md ogbata with order to DC pt home

## 2023-06-11 ENCOUNTER — Telehealth: Payer: Self-pay

## 2023-06-11 LAB — HEMOGLOBIN A1C
Hgb A1c MFr Bld: 5.7 % — ABNORMAL HIGH (ref 4.8–5.6)
Mean Plasma Glucose: 117 mg/dL

## 2023-06-11 NOTE — Transitions of Care (Post Inpatient/ED Visit) (Signed)
   06/11/2023  Name: Rebecca Hubbard MRN: [id number] DOB: June 07, 1940  Today's TOC FU Call Status: Today's TOC FU Call Status:: Unsuccessful Call (1st Attempt) Unsuccessful Call (1st Attempt) Date: 06/11/23  Attempted to reach the patient regarding the most recent Inpatient/ED visit.  Follow Up Plan: Additional outreach attempts will be made to reach the patient to complete the Transitions of Care (Post Inpatient/ED visit) call.   Signature Agnes Lawrence, CMA (AAMA)  [facility] [phone]

## 2023-06-12 ENCOUNTER — Telehealth: Payer: Self-pay

## 2023-06-12 NOTE — Telephone Encounter (Signed)
 Copied from CRM [id number]. Topic: Clinical - Home Health Verbal Orders >> Jun 11, 2023  4:32 PM Fuller Mandril wrote: Caller/Agency: Arlys John PT with [facility]  Callback Number: [phone] Service Requested: Physical Therapy Frequency: 1 time per week for 8 weeks  Any new concerns about the patient? No  Spoke with brian and gave him the ok to move forward wotht the verbal orders that he is requesting

## 2023-06-16 ENCOUNTER — Encounter: Payer: Self-pay | Admitting: Family Medicine

## 2023-06-16 ENCOUNTER — Telehealth (INDEPENDENT_AMBULATORY_CARE_PROVIDER_SITE_OTHER): Payer: Commercial Managed Care - PPO | Admitting: Family Medicine

## 2023-06-16 VITALS — Ht <= 58 in | Wt 180.0 lb

## 2023-06-16 DIAGNOSIS — E782 Mixed hyperlipidemia: Secondary | ICD-10-CM | POA: Diagnosis not present

## 2023-06-16 DIAGNOSIS — F0280 Dementia in other diseases classified elsewhere without behavioral disturbance: Secondary | ICD-10-CM

## 2023-06-16 DIAGNOSIS — G459 Transient cerebral ischemic attack, unspecified: Secondary | ICD-10-CM

## 2023-06-16 DIAGNOSIS — G3109 Other frontotemporal dementia: Secondary | ICD-10-CM

## 2023-06-16 DIAGNOSIS — I10 Essential (primary) hypertension: Secondary | ICD-10-CM | POA: Diagnosis not present

## 2023-06-16 NOTE — Progress Notes (Signed)
 Virtual Visit via Video Note  Subjective  CC:  Chief Complaint  Patient presents with   Hospitalization Follow-up    06/09/2023 - 06/10/2023 (21 hours) [name]          I connected with Kari Baars [facility] on 06/16/23 at  1:30 PM EST by a video enabled telemedicine application and verified that I am speaking with the correct person using two identifiers. Location patient: Home Location provider: [facility] [address], Office Persons participating in the virtual visit: Lanasia Porras [place], Willow Ora, MD patricia kennedy CMA  I discussed the limitations of evaluation and management by telemedicine and the availability of in person appointments. The patient expressed understanding and agreed to proceed. HPI: Rebecca Hubbard is a 83 y.o. female who was contacted today to address the problems listed above in the chief complaint. Reviewed ED notes and observation status: reviewed brain ct and MRI;  Reviewed labs Briefly: Weakness left-sided facial droop and left-sided weakness that resolved within about 3 hours.  Did not qualify for code stroke.  Was monitored in the hospital.  No arrhythmia.  Echocardiogram was completed and was noncontributory.  I cannot see neurology notes but I do believe they were consulted.  Plavix was started.  Blood pressure was elevated and maintained.  But now has normalized since back at home.  A1c was high normal and LDL was 106.  She is statin intolerant on Zetia.  Patient and daughter reports she is back to baseline.  Feeling well.  No concerns.  Patient has FTD.  Has limited memory of events.  Assessment  1. TIA (transient ischemic attack)   2. Essential hypertension   3. Frontotemporal dementia (HCC)   4. Mixed hyperlipidemia      Plan  TIA: Fortunately resolved.  On Plavix for prevention.  Has follow-up with neurology next month.  Secondary prevention includes keeping  blood pressure controlled which currently it is.  Can consider Repatha or other cholesterol-lowering medication to push LDL less than 100 however currently daughter is not in favor of adding medications.  Will discuss with neurology.  No change in medications today. FTD: Encouraged activity, PT participation and staying engaged with her family.  Today she looks well. I discussed the assessment and treatment plan with the patient. The patient was provided an opportunity to ask questions and all were answered. The patient agreed with the plan and demonstrated an understanding of the instructions.   The patient was advised to call back or seek an in-person evaluation if the symptoms worsen or if the condition fails to improve as anticipated. Follow up: 3 months for recheck Visit date not found  No orders of the defined types were placed in this encounter.     I reviewed the patients updated PMH, FH, and SocHx.    Patient Active Problem List   Diagnosis Date Noted   TIA (transient ischemic attack) 06/09/2023    Priority: High   Renal malignant neoplasm, left (HCC) 11/29/2021    Priority: High   Frontotemporal dementia (HCC) 06/15/2020    Priority: High   Depression, recurrent (HCC) 05/25/2018    Priority: High   Mixed hyperlipidemia 03/08/2017    Priority: High   Essential hypertension 10/12/2010    Priority: High   Hypothyroidism due to acquired atrophy of thyroid 10/12/2010    Priority: High   Glaucoma 05/25/2018    Priority: Medium    Aortic stenosis,  mild 09/10/2017    Priority: Medium    Chronic tophaceous gout 07/08/2017    Priority: Medium    LPRD (laryngopharyngeal reflux disease) 04/18/2015    Priority: Medium    Moderate persistent asthma 04/18/2015    Priority: Medium    Colon adenoma 11/03/2013    Priority: Medium    Osteoarthritis 10/12/2010    Priority: Medium    Statin intolerance 05/25/2018    Priority: Low   Urge incontinence of urine 07/08/2017     Priority: Low   Other allergic rhinitis 04/18/2015    Priority: Low   Current Meds  Medication Sig   albuterol (VENTOLIN HFA) 108 (90 Base) MCG/ACT inhaler Inhale 2 puffs into the lungs every 6 (six) hours as needed for wheezing or shortness of breath.    calcium carbonate (OS-CAL) 600 MG TABS tablet Take 600 mg by mouth daily.   clopidogrel (PLAVIX) 75 MG tablet Take 1 tablet (75 mg total) by mouth daily.   DULERA 200-5 MCG/ACT AERO USE 2 PUFFS WITH SPACER EVERY 12 HOURS TO PREVENT COUGH OR WHEEZE, RINSE, GARGLE AND SPIT AFTER USE   escitalopram (LEXAPRO) 20 MG tablet Take 1 tablet (20 mg total) by mouth daily.   ezetimibe (ZETIA) 10 MG tablet TAKE 1 TABLET BY MOUTH EVERY DAY   fexofenadine (ALLEGRA) 180 MG tablet Take 180 mg by mouth as needed for allergies.   latanoprost (XALATAN) 0.005 % ophthalmic solution Place 1 drop into both eyes at bedtime.   levothyroxine (SYNTHROID) 88 MCG tablet TAKE 1 TABLET BY MOUTH EVERY DAY   memantine (NAMENDA XR) 28 MG CP24 24 hr capsule TAKE 1 CAPSULE BY MOUTH DAILY.   rosuvastatin (CRESTOR) 5 MG tablet Take 1 tablet (5 mg total) by mouth at bedtime.   triamterene-hydrochlorothiazide (MAXZIDE-25) 37.5-25 MG tablet TAKE 1 TABLET BY MOUTH EVERY DAY    Allergies: Patient is allergic to penicillins, sulfa antibiotics, aspirin, and atorvastatin. Family History: Patient family history includes Alzheimer's disease in her father; Cancer in her sister; Stroke in her mother. Social History:  Patient  reports that she quit smoking about 43 years ago. Her smoking use included cigarettes. She has never used smokeless tobacco. She reports that she does not currently use alcohol after a past usage of about 5.0 standard drinks of alcohol per week. She reports that she does not use drugs.  Review of Systems: Constitutional: Negative for fever malaise or anorexia Cardiovascular: negative for chest pain Respiratory: negative for SOB or persistent  cough Gastrointestinal: negative for abdominal pain  OBJECTIVE Vitals: Ht 4\' 10"  (1.473 m)   Wt 180 lb (81.6 kg)   BMI 37.62 kg/m  General: no acute distress , A&Ox3 Appears well, normal speech.  Normal affect.  Answers questions appropriately.  Willow Ora, MD

## 2023-06-20 ENCOUNTER — Telehealth: Payer: Self-pay | Admitting: Family Medicine

## 2023-06-20 NOTE — Telephone Encounter (Signed)
 Form has been placed in provider's box to complete

## 2023-06-20 NOTE — Telephone Encounter (Signed)
 Patient dropped off document  Medical examination report , to be filled out by provider. Patient requested to send it back via Call Patient to pick up within 5-days. Document is located in providers tray at front office.Please advise at  [phone].

## 2023-06-23 ENCOUNTER — Encounter: Payer: Self-pay | Admitting: Family Medicine

## 2023-07-14 ENCOUNTER — Telehealth: Payer: Self-pay | Admitting: Family Medicine

## 2023-07-14 NOTE — Telephone Encounter (Signed)
 Patient dropped off document Home Health Certificate (Order ID [id number]), to be filled out by provider. Patient requested to send it back via Fax within 5-days. Document is located in providers tray at front office.Please advise at  [phone].

## 2023-07-15 NOTE — Telephone Encounter (Signed)
Form placed in provider's box for completion

## 2023-07-20 ENCOUNTER — Other Ambulatory Visit: Payer: Self-pay | Admitting: Family Medicine

## 2023-08-01 ENCOUNTER — Encounter: Payer: Commercial Managed Care - PPO | Admitting: Family Medicine

## 2023-08-03 ENCOUNTER — Other Ambulatory Visit: Payer: Self-pay | Admitting: Internal Medicine

## 2023-09-09 ENCOUNTER — Other Ambulatory Visit: Payer: Self-pay

## 2023-09-09 ENCOUNTER — Encounter: Payer: Self-pay | Admitting: Family Medicine

## 2023-09-09 MED ORDER — CLOPIDOGREL BISULFATE 75 MG PO TABS: 75.0000 mg | ORAL_TABLET | Freq: Every day | ORAL | 3 refills | Status: AC

## 2023-09-30 ENCOUNTER — Emergency Department (HOSPITAL_COMMUNITY)
Admission: EM | Admit: 2023-09-30 | Discharge: 2023-09-30 | Disposition: A | Discharge status: Home / Self Care | Attending: Emergency Medicine | Admitting: Emergency Medicine

## 2023-09-30 ENCOUNTER — Emergency Department (HOSPITAL_COMMUNITY)

## 2023-09-30 ENCOUNTER — Other Ambulatory Visit: Payer: Self-pay

## 2023-09-30 DIAGNOSIS — F0280 Dementia in other diseases classified elsewhere without behavioral disturbance: Secondary | ICD-10-CM | POA: Insufficient documentation

## 2023-09-30 DIAGNOSIS — S0101XA Laceration without foreign body of scalp, initial encounter: Secondary | ICD-10-CM | POA: Insufficient documentation

## 2023-09-30 DIAGNOSIS — S0003XA Contusion of scalp, initial encounter: Secondary | ICD-10-CM

## 2023-09-30 DIAGNOSIS — Z7902 Long term (current) use of antithrombotics/antiplatelets: Secondary | ICD-10-CM | POA: Diagnosis not present

## 2023-09-30 DIAGNOSIS — G309 Alzheimer's disease, unspecified: Secondary | ICD-10-CM | POA: Insufficient documentation

## 2023-09-30 DIAGNOSIS — Z8673 Personal history of transient ischemic attack (TIA), and cerebral infarction without residual deficits: Secondary | ICD-10-CM | POA: Insufficient documentation

## 2023-09-30 DIAGNOSIS — W0110XA Fall on same level from slipping, tripping and stumbling with subsequent striking against unspecified object, initial encounter: Secondary | ICD-10-CM | POA: Insufficient documentation

## 2023-09-30 DIAGNOSIS — W19XXXA Unspecified fall, initial encounter: Secondary | ICD-10-CM

## 2023-09-30 LAB — CBC WITH DIFFERENTIAL/PLATELET
Abs Immature Granulocytes: 0.06 10*3/uL (ref 0.00–0.07)
Basophils Absolute: 0.1 10*3/uL (ref 0.0–0.1)
Basophils Relative: 1 %
Eosinophils Absolute: 0.1 10*3/uL (ref 0.0–0.5)
Eosinophils Relative: 1 %
HCT: 36.4 % (ref 36.0–46.0)
Hemoglobin: 12.4 g/dL (ref 12.0–15.0)
Immature Granulocytes: 1 %
Lymphocytes Relative: 13 %
Lymphs Abs: 1.3 10*3/uL (ref 0.7–4.0)
MCH: 31.1 pg (ref 26.0–34.0)
MCHC: 34.1 g/dL (ref 30.0–36.0)
MCV: 91.2 fL (ref 80.0–100.0)
Monocytes Absolute: 0.6 10*3/uL (ref 0.1–1.0)
Monocytes Relative: 6 %
Neutro Abs: 7.8 10*3/uL — ABNORMAL HIGH (ref 1.7–7.7)
Neutrophils Relative %: 78 %
Platelets: 223 10*3/uL (ref 150–400)
RBC: 3.99 MIL/uL (ref 3.87–5.11)
RDW: 13.5 % (ref 11.5–15.5)
WBC: 10 10*3/uL (ref 4.0–10.5)
nRBC: 0 % (ref 0.0–0.2)

## 2023-09-30 LAB — BASIC METABOLIC PANEL WITH GFR
Anion gap: 12 (ref 5–15)
BUN: 14 mg/dL (ref 8–23)
CO2: 25 mmol/L (ref 22–32)
Calcium: 8.2 mg/dL — ABNORMAL LOW (ref 8.9–10.3)
Chloride: 96 mmol/L — ABNORMAL LOW (ref 98–111)
Creatinine, Ser: 0.88 mg/dL (ref 0.44–1.00)
GFR, Estimated: >60 mL/min (ref >60)
Glucose, Bld: 98 mg/dL (ref 70–99)
Potassium: 3.5 mmol/L (ref 3.5–5.1)
Sodium: 133 mmol/L — ABNORMAL LOW (ref 135–145)

## 2023-09-30 MED ORDER — SODIUM CHLORIDE 0.9 % IV BOLUS
1000.0000 mL | Freq: Once | INTRAVENOUS | Status: AC
  Administered 2023-09-30: 1000 mL via INTRAVENOUS

## 2023-09-30 MED ORDER — LIDOCAINE-EPINEPHRINE 1 %-1:100000 IJ SOLN
10.0000 mL | Freq: Once | INTRAMUSCULAR | Status: AC
  Administered 2023-09-30: 10 mL
  Filled 2023-09-30: qty 1

## 2023-09-30 NOTE — ED Notes (Signed)
 Per lab, no samples available to run. PA notified, to recollect

## 2023-09-30 NOTE — Discharge Instructions (Signed)
 It was a pleasure taking care of Rebecca Hubbard today.  Her workup today was reassuring.  I would try and keep the bandage on her head for the next 2 days.  Do not wash her hair as well.  She has #4 sutures in her scalp.  Will need to come out in 1 week.  This can be done at primary care office, urgent care  Return for new or worsening symptoms

## 2023-09-30 NOTE — ED Triage Notes (Signed)
 Patient brought ib, tripped over a sheet hit back of head. Small lac bleeding controlled. Hx of alzheimer's and dementia asking repeated questions. Remembers falling. C-collar placed for precaution. Head pain at site of injury. On plavix .   EMS  148/60 HR 70 99 on room air  CBG 100

## 2023-09-30 NOTE — ED Notes (Signed)
 Pt ambulated to bathroom using rolling walker with RN and NT at side. No assistance needed

## 2023-09-30 NOTE — ED Notes (Signed)
PA notified of BP 

## 2023-09-30 NOTE — ED Provider Notes (Cosign Needed Addendum)
 [place] EMERGENCY DEPARTMENT AT [facility] Provider Note   CSN: [id number] Arrival date & time: 09/30/23  1506   Patient presents with: Rebecca Hubbard is a 83 y.o. female history of dementia, TIA, aortic stenosis, on Plavix  here for eval mechanical fall.  Reportedly tripped over a sheet.  Has pain and some bleeding to the posterior head.  She denies any syncope.  No neck pain, back pain, chest pain, abdominal pain, numbness or weakness.  No pain or swelling the legs.  At baseline mentation per EMS   HPI     Prior to Admission medications   Medication Sig Start Date End Date Taking? Authorizing Provider  albuterol  (VENTOLIN  HFA) 108 (90 Base) MCG/ACT inhaler Inhale 2 puffs into the lungs every 6 (six) hours as needed for wheezing or shortness of breath.  05/30/10   [provider]  calcium  carbonate (OS-CAL) 600 MG TABS tablet Take 600 mg by mouth daily.    [provider]  clopidogrel  (PLAVIX ) 75 MG tablet Take 1 tablet (75 mg total) by mouth daily. 09/09/23   Luevenia Saha, MD  DULERA  200-5 MCG/ACT AERO USE 2 PUFFS WITH SPACER EVERY 12 HOURS TO PREVENT COUGH OR WHEEZE, RINSE, GARGLE AND SPIT AFTER USE 04/18/15   Kozlow, Rema Care, MD  escitalopram  (LEXAPRO ) 20 MG tablet TAKE 1 TABLET BY MOUTH EVERY DAY 07/20/23   Luevenia Saha, MD  ezetimibe  (ZETIA ) 10 MG tablet TAKE 1 TABLET BY MOUTH EVERY DAY 01/13/23   Luevenia Saha, MD  fexofenadine (ALLEGRA) 180 MG tablet Take 180 mg by mouth as needed for allergies.    [provider]  latanoprost  (XALATAN ) 0.005 % ophthalmic solution Place 1 drop into both eyes at bedtime. 10/06/13   [provider]  levothyroxine  (SYNTHROID ) 88 MCG tablet TAKE 1 TABLET BY MOUTH EVERY DAY 11/14/22   Luevenia Saha, MD  memantine  (NAMENDA  XR) 28 MG CP24 24 hr capsule TAKE 1 CAPSULE BY MOUTH DAILY. 05/06/23   Millikan, Megan, NP  rosuvastatin  (CRESTOR ) 5 MG tablet Take 1 tablet (5 mg total) by mouth at bedtime.  01/21/23   Luevenia Saha, MD  triamterene -hydrochlorothiazide  (MAXZIDE -25) 37.5-25 MG tablet TAKE 1 TABLET BY MOUTH EVERY DAY 05/12/23   Luevenia Saha, MD    Allergies: Penicillins, Sulfa antibiotics, Aspirin, and Atorvastatin    Review of Systems  Constitutional: Negative.   HENT: Negative.    Respiratory: Negative.    Cardiovascular: Negative.   Gastrointestinal: Negative.   Genitourinary: Negative.   Musculoskeletal: Negative.   Skin:  Positive for wound.  Neurological:  Positive for headaches.  All other systems reviewed and are negative.   Updated Vital Signs BP 118/71   Pulse 77   Temp 97.8 F (36.6 C) (Oral)   Resp 19   Ht 4' 10 (1.473 m)   Wt 81.6 kg   SpO2 100%   BMI 37.60 kg/m   Physical Exam Vitals and nursing note reviewed.  Constitutional:      General: She is not in acute distress.    Appearance: She is well-developed. She is not ill-appearing, toxic-appearing or diaphoretic.  HENT:     Head: Normocephalic.     Comments: Hematoma parietal/ occipital scalp    Nose: Nose normal.     Mouth/Throat:     Mouth: Mucous membranes are moist.   Eyes:     Pupils: Pupils are equal, round, and reactive to light.    Cardiovascular:  Rate and Rhythm: Normal rate.     Pulses: Normal pulses.          Radial pulses are 2+ on the right side and 2+ on the left side.       Dorsalis pedis pulses are 2+ on the right side and 2+ on the left side.     Heart sounds: Normal heart sounds.  Pulmonary:     Effort: Pulmonary effort is normal. No respiratory distress.     Breath sounds: Normal breath sounds.  Abdominal:     General: There is no distension.     Palpations: Abdomen is soft.     Tenderness: There is no abdominal tenderness. There is no right CVA tenderness, left CVA tenderness or guarding.   Musculoskeletal:        General: Normal range of motion.     Cervical back: Normal range of motion.     Comments: No bony tenderness, compartments soft.  Lifts  bilateral legs off bed without difficulty.  No shortening or rotation of legs.   Skin:    General: Skin is warm and dry.     Capillary Refill: Capillary refill takes less than 2 seconds.     Findings: Lesion present.     Comments: Large scalp hematoma occipital region.  Slow oozing.  No pulsatile bleeding.   Neurological:     Mental Status: She is alert. Mental status is at baseline.   Psychiatric:        Mood and Affect: Mood normal.     (all labs ordered are listed, but only abnormal results are displayed) Labs Reviewed  CBC WITH DIFFERENTIAL/PLATELET - Abnormal; Notable for the following components:      Result Value   Neutro Abs 7.8 (*)    All other components within normal limits  BASIC METABOLIC PANEL WITH GFR - Abnormal; Notable for the following components:   Sodium 133 (*)    Chloride 96 (*)    Calcium  8.2 (*)    All other components within normal limits    EKG: EKG Interpretation Date/Time:  Tuesday September 30 2023 15:51:11 EDT Ventricular Rate:  73 PR Interval:  172 QRS Duration:  70 QT Interval:  370 QTC Calculation: 408 R Axis:   49  Text Interpretation: Sinus rhythm Low voltage, precordial leads No acute changes No significant change since last tracing Confirmed by Deatra Face ([id number]) on 09/30/2023 9:38:45 PM  Radiology: Lenell Query Chest Portable 1 View Result Date: 09/30/2023 CLINICAL DATA:  Rebecca Hubbard, anticoagulated EXAM: PORTABLE CHEST 1 VIEW COMPARISON:  06/09/2023 FINDINGS: Single frontal view of the chest demonstrates a stable cardiac silhouette. Continued ectasia and atherosclerosis of the thoracic aorta. No airspace disease, effusion, or pneumothorax. No acute displaced fracture. IMPRESSION: 1. No acute intrathoracic process. Electronically Signed   By: Bobbye Burrow M.D.   On: 09/30/2023 16:22   CT Cervical Spine Wo Contrast Result Date: 09/30/2023 CLINICAL DATA:  Tripped and fell, hit head, neck trauma EXAM: CT CERVICAL SPINE WITHOUT CONTRAST TECHNIQUE:  Multidetector CT imaging of the cervical spine was performed without intravenous contrast. Multiplanar CT image reconstructions were also generated. RADIATION DOSE REDUCTION: This exam was performed according to the departmental dose-optimization program which includes automated exposure control, adjustment of the mA and/or kV according to patient size and/or use of iterative reconstruction technique. COMPARISON:  01/28/2014 FINDINGS: Alignment: Grossly normal anatomic alignment. Skull base and vertebrae: No acute fracture. No primary bone lesion or focal pathologic process. Soft tissues and spinal canal: No prevertebral fluid  or swelling. No visible canal hematoma. Disc levels: Multilevel cervical spondylosis from C3-4 through C5-6, with slight progression since prior study. Upper chest: Airway is patent. Visualized portions of the lung apices are clear. Other: Reconstructed images demonstrate no additional findings. IMPRESSION: 1. No acute cervical spine fracture. 2. Progressive multilevel cervical spondylosis. Electronically Signed   By: Bobbye Burrow M.D.   On: 09/30/2023 16:21   CT Head Wo Contrast Result Date: 09/30/2023 CLINICAL DATA:  Tripped, hit back of head, laceration EXAM: CT HEAD WITHOUT CONTRAST TECHNIQUE: Contiguous axial images were obtained from the base of the skull through the vertex without intravenous contrast. RADIATION DOSE REDUCTION: This exam was performed according to the departmental dose-optimization program which includes automated exposure control, adjustment of the mA and/or kV according to patient size and/or use of iterative reconstruction technique. COMPARISON:  06/09/2023 FINDINGS: Brain: No acute infarct or hemorrhage. Lateral ventricles and midline structures are stable. No acute extra-axial fluid collections. No mass effect. Stable cerebral atrophy. Vascular: No hyperdense vessel or unexpected calcification. Skull: Large left parietal scalp hematoma and skin laceration. No  underlying fracture. The remainder of the calvarium is unremarkable. Sinuses/Orbits: Polypoid mucosal thickening of the maxillary sinuses. Remaining paranasal sinuses are clear. Other: None. IMPRESSION: 1. Large left parietal scalp hematoma and laceration. No underlying fracture. 2. No acute intracranial process. Electronically Signed   By: Bobbye Burrow M.D.   On: 09/30/2023 16:18   DG Pelvis Portable Result Date: 09/30/2023 CLINICAL DATA:  Rebecca Hubbard EXAM: PORTABLE PELVIS 1-2 VIEWS COMPARISON:  08/22/2021 FINDINGS: Supine frontal view of the pelvis includes both hips. No acute displaced fracture. Symmetrical bilateral hip osteoarthritis. Sacroiliac joints are unremarkable. IMPRESSION: 1. No acute displaced fracture. Electronically Signed   By: Bobbye Burrow M.D.   On: 09/30/2023 16:16     .Laceration Repair  Date/Time: 09/30/2023 7:48 PM  Performed by: Coleta David A, PA-C Authorized by: Dickson Founds, PA-C   Consent:    Consent obtained:  Verbal   Consent given by:  Patient and guardian   Risks, benefits, and alternatives were discussed: yes     Risks discussed:  Retained foreign body, tendon damage, vascular damage, need for additional repair, nerve damage, poor wound healing, poor cosmetic result, pain and infection   Alternatives discussed:  No treatment, delayed treatment, observation and referral Universal protocol:    Procedure explained and questions answered to patient or proxy's satisfaction: yes     Relevant documents present and verified: yes     Test results available: yes     Imaging studies available: yes     Required blood products, implants, devices, and special equipment available: yes     Site/side marked: yes     Immediately prior to procedure, a time out was called: yes     Patient identity confirmed:  Verbally with patient Anesthesia:    Anesthesia method:  Local infiltration   Local anesthetic:  Lidocaine 1% WITH epi Laceration details:    Location:   Scalp   Scalp location:  L parietal   Length (cm):  0.5 Pre-procedure details:    Preparation:  Patient was prepped and draped in usual sterile fashion and imaging obtained to evaluate for foreign bodies Exploration:    Limited defect created (wound extended): no     Hemostasis achieved with:  Tied off vessels   Imaging obtained comment:  CT head   Imaging outcome: foreign body not noted     Wound extent: vascular damage     Wound  extent: no nerve damage, no tendon damage and no underlying fracture     Wound extent comment:  Arteriole bleed   Contaminated: no   Treatment:    Area cleansed with:  Povidone-iodine   Amount of cleaning:  Extensive   Irrigation solution:  Sterile saline   Irrigation volume:  1 L   Visualized foreign bodies/material removed: no   Skin repair:    Repair method:  Sutures   Suture size:  3-0   Suture material:  Prolene   Suture technique:  Figure eight   Number of sutures:  4 Approximation:    Approximation:  Close Repair type:    Repair type:  Complex Post-procedure details:    Dressing:  Bulky dressing and non-adherent dressing   Procedure completion:  Tolerated Comments:     Persistent arterial bleed requiring #4 figure-of-eight sutures .Critical Care  Performed by: Dickson Founds, PA-C Authorized by: Dickson Founds, PA-C   Critical care provider statement:    Critical care time (minutes):  31   Critical care was necessary to treat or prevent imminent or life-threatening deterioration of the following conditions:  Trauma (Difficult to control arterial bleed, fall on anticoagulation)   Critical care was time spent personally by me on the following activities:  Development of treatment plan with patient or surrogate, discussions with consultants, evaluation of patient's response to treatment, examination of patient, ordering and review of laboratory studies, ordering and review of radiographic studies, ordering and performing treatments and  interventions, pulse oximetry, re-evaluation of patient's condition and review of old charts    Medications Ordered in the ED  lidocaine-EPINEPHrine (XYLOCAINE W/EPI) 1 %-1:100000 (with pres) injection 10 mL (10 mLs Infiltration Given 09/30/23 1636)  sodium chloride  0.9 % bolus 1,000 mL (0 mLs Intravenous Stopped 09/30/23 3434)    83 year old multiple medical comorbidities here for evaluation mechanical fall.  On Plavix , came in as a level 2 fall on thinners.  She is a scalp hematoma anterior posterior head.  No bony tenderness to extremities.  Will plan on checking basic labs, imaging and reassess  Labs and imaging personally viewed and interpreted:  CBC without leukocytosis, hemoglobin 12.4 BMP without significant abnormality CT head large parietal L scalp hematoma with laceration, no fracture, intracranial bleed Ct cervical that significant abnormality DG chest without acute abnormality DG pelvis wo acute abnormality EKG without ischemic changes  Called to bedside.  Nursing stated patient's bleeding has significantly increased after patient had moved her dressing.  I went to bedside patient appears to have arterial bleeding at this time.  Pulsatile bleeding present.  Verbal orders for lido with epi given.  Difficult time controlling bleeding.  At bedside for at least 30 minutes due to repeat bleeding.  Ultimately I was able to place #4 figure-of-eight sutures with good control. Gauze placed to site.  Patient reassesed.  Did get lightheaded when she went to stand.  Will give some IV fluids and reassess.  Patient would like to go home.  Will plan on reassessment.  After IV fluids patient reassessed.  She feels significantly improved.  She is able to walk to the bathroom with RN at side however did not need any assistance.  Family comfortable taking patient home.  We discussed wound care as well as having her dangle feet on the edge of the bed prior to walking.  They have family at home.  Will  have follow-up outpatient, return for new or worsening symptoms.  The patient has been appropriately medically  screened and/or stabilized in the ED. I have low suspicion for any other emergent medical condition which would require further screening, evaluation or treatment in the ED or require inpatient management.  Patient is hemodynamically stable and in no acute distress.  Patient able to ambulate in department prior to ED.  Evaluation does not show acute pathology that would require ongoing or additional emergent interventions while in the emergency department or further inpatient treatment.  I have discussed the diagnosis with the patient and answered all questions.  Pain is been managed while in the emergency department and patient has no further complaints prior to discharge.  Patient is comfortable with plan discussed in room and is stable for discharge at this time.  I have discussed strict return precautions for returning to the emergency department.  Patient was encouraged to follow-up with PCP/specialist refer to at discharge.   Clinical Course as of 09/30/23 2252  Tue Sep 30, 2023  1714 Developed arterial bleed.  3 figure-of-eight sutures placed. [BH]  1818 Head reassessed.  No further bleeding. [BH]  1858 Patient went to standing at lightheaded.  Will give IV fluid bolus. [BH]    Clinical Course User Index [BH] Daniel Johndrow A, PA-C                                 Medical Decision Making Amount and/or Complexity of Data Reviewed Independent Historian: EMS External Data Reviewed: labs, radiology and notes. Labs: ordered. Decision-making details documented in ED Course. Radiology: ordered and independent interpretation performed. Decision-making details documented in ED Course.  Risk OTC drugs. Prescription drug management. Parenteral controlled substances. Decision regarding hospitalization. Diagnosis or treatment significantly limited by social determinants of  health.      Final diagnoses:  Fall, initial encounter  Laceration of scalp, initial encounter  Long term (current) use of antithrombotics/antiplatelets  Hematoma of scalp, initial encounter    ED Discharge Orders     None         Rocio Wolak A, PA-C 09/30/23 2252

## 2023-09-30 NOTE — ED Notes (Signed)
 Patient attempted to ambulate by RN and grandson. Patient endorses dizziness that worsens with standing. Patient sliding away from walker. Assisted to bed by RN and grandson. PA notified and at bedside

## 2023-10-09 ENCOUNTER — Ambulatory Visit: Admitting: Family

## 2023-10-09 ENCOUNTER — Encounter: Payer: Self-pay | Admitting: Family

## 2023-10-09 VITALS — BP 148/70 | HR 78 | Temp 98.1°F | Ht <= 58 in | Wt 177.0 lb

## 2023-10-09 DIAGNOSIS — S0101XA Laceration without foreign body of scalp, initial encounter: Secondary | ICD-10-CM | POA: Diagnosis not present

## 2023-10-09 DIAGNOSIS — Z4802 Encounter for removal of sutures: Secondary | ICD-10-CM

## 2023-10-09 NOTE — Progress Notes (Signed)
 Patient ID: Rebecca Hubbard, female    DOB: [birth year], 83 y.o.   MRN: [id number]  Chief Complaint  Patient presents with   Suture / Staple Removal    From back of head; had a fall on 09/29/23 and had to get 6 sutures, done at [facility]; BP elevated due to no medication taken today  Discussed the use of AI scribe software for clinical note transcription with the patient, who gave verbal consent to proceed.  History of Present Illness Rebecca Hubbard is an 83 year old female who presents with her daughter for suture removal following a fall and head injury.  Nine days ago, she fell and hit the back of her head after tripping over a towel. She was not using her walker at the time. The fall resulted in a head injury requiring sutures. Due to blood thinners, she experienced significant bleeding, complicating wound cleaning. A persistent hematoma on her head feels 'tight' and 'pulsing' but is not painful. Ice application has been attempted with limited success. There is confusion regarding the number of sutures, but hospital notes mention four, and her nephew was told six. She lives with her sister and her sister's dog. Her daughter suggests consistent use of her walker to prevent future falls. No itching at the suture site and no pain from the lump on her head.  Assessment & Plan Head laceration with sutures Sutures in place on left crown of scalp for nine days, ready for removal. Hematoma present with mild erythema but improved according to daughter, slow healing likely d/t anticoagulant use, and minimal ice application d/t pt low tolerance. No severe pain still reported, mild throbbing discomfort at times. Discussed infection monitoring and ice application for hematoma. The wound is well healed with thick scab of dried blood without signs of infection.  -  Verbal permission received for suture removal. Scalp cleaned with saline solution, able to remove 4 sutrues without difficulty, no  bleeding noted, wound cleaned again with povidine and alcohol, pt advised on gentle washing with soap and water daily. - Instruct to apply ice to hematoma to reduce swelling and prevent calcification. - Advise use of warm water and petrolatum or KY jelly to soften dried blood. - Monitor for signs of infection such as increased redness, swelling, or oozing, call office if noted. - Keep scheduled follow-up with Dr Jodie on Tuesday.  Fall risk Clemens due to tripping over a towel. Uses a walker, advised consistent use to prevent falls. - Encourage consistent use of walker indoors to prevent falls.   Subjective:    Outpatient Medications Prior to Visit  Medication Sig Dispense Refill   albuterol  (VENTOLIN  HFA) 108 (90 Base) MCG/ACT inhaler Inhale 2 puffs into the lungs every 6 (six) hours as needed for wheezing or shortness of breath.      calcium  carbonate (OS-CAL) 600 MG TABS tablet Take 600 mg by mouth daily.     clopidogrel  (PLAVIX ) 75 MG tablet Take 1 tablet (75 mg total) by mouth daily. 90 tablet 3   DULERA  200-5 MCG/ACT AERO USE 2 PUFFS WITH SPACER EVERY 12 HOURS TO PREVENT COUGH OR WHEEZE, RINSE, GARGLE AND SPIT AFTER USE 1 Inhaler 5   escitalopram  (LEXAPRO ) 20 MG tablet TAKE 1 TABLET BY MOUTH EVERY DAY 90 tablet 3   ezetimibe  (ZETIA ) 10 MG tablet TAKE 1 TABLET BY MOUTH EVERY DAY 90 tablet 3   fexofenadine (ALLEGRA) 180 MG tablet Take 180 mg by mouth as needed for allergies.  latanoprost  (XALATAN ) 0.005 % ophthalmic solution Place 1 drop into both eyes at bedtime.     levothyroxine  (SYNTHROID ) 88 MCG tablet TAKE 1 TABLET BY MOUTH EVERY DAY 90 tablet 3   meloxicam (MOBIC) 15 MG tablet Take 15 mg by mouth daily.     memantine  (NAMENDA  XR) 28 MG CP24 24 hr capsule TAKE 1 CAPSULE BY MOUTH DAILY. 90 capsule 2   rosuvastatin  (CRESTOR ) 5 MG tablet Take 1 tablet (5 mg total) by mouth at bedtime. 90 tablet 3   triamterene -hydrochlorothiazide  (MAXZIDE -25) 37.5-25 MG tablet TAKE 1 TABLET BY MOUTH  EVERY DAY 90 tablet 3   No facility-administered medications prior to visit.   Past Medical History:  Diagnosis Date   Arthritis    knees, cortisone injections   Asthma    Depression, recurrent (HCC) 05/25/2018   Sept  - January: since divorcing her husband many years ago.    Frontotemporal dementia (HCC) 06/15/2020   neurocog testing 05/2019   GERD (gastroesophageal reflux disease)    Glaucoma    Hyperlipidemia    Hypertension    Hypothyroidism    Seasonal allergies    Vertigo    Past Surgical History:  Procedure Laterality Date   ABDOMINAL HYSTERECTOMY     MULTIPLE TOOTH EXTRACTIONS     top    THYROIDECTOMY     Allergies  Allergen Reactions   Penicillins Hives   Sulfa Antibiotics Other (See Comments), Anaphylaxis and Swelling    Other Reaction: Other reaction   Aspirin Rash   Atorvastatin Other (See Comments)    Cramps       Objective:    Physical Exam Vitals and nursing note reviewed.  Constitutional:      Appearance: Normal appearance.  HENT:     Head:    Cardiovascular:     Rate and Rhythm: Normal rate and regular rhythm.  Pulmonary:     Effort: Pulmonary effort is normal.     Breath sounds: Normal breath sounds.   Musculoskeletal:        General: Normal range of motion.   Skin:    General: Skin is warm and dry.     Findings: Erythema (mild, golf ball size hematoma on left crown of head) and laceration (left crown of head, thick matted blood noted with 4 blue sutures intact) present.   Neurological:     Mental Status: She is alert.   Psychiatric:        Mood and Affect: Mood normal.        Behavior: Behavior normal.    BP (!) 151/71   Pulse 78   Temp 98.1 F (36.7 C)   Ht 4' 10 (1.473 m)   Wt 177 lb (80.3 kg)   SpO2 99%   BMI 36.99 kg/m  Wt Readings from Last 3 Encounters:  10/09/23 177 lb (80.3 kg)  09/30/23 179 lb 14.3 oz (81.6 kg)  06/16/23 180 lb (81.6 kg)      Lucius Krabbe, NP

## 2023-10-14 ENCOUNTER — Ambulatory Visit (INDEPENDENT_AMBULATORY_CARE_PROVIDER_SITE_OTHER): Admitting: Family Medicine

## 2023-10-14 ENCOUNTER — Encounter: Payer: Self-pay | Admitting: Family Medicine

## 2023-10-14 VITALS — BP 134/83 | HR 72 | Temp 97.7°F | Ht <= 58 in | Wt 176.4 lb

## 2023-10-14 DIAGNOSIS — W19XXXD Unspecified fall, subsequent encounter: Secondary | ICD-10-CM | POA: Diagnosis not present

## 2023-10-14 DIAGNOSIS — G3109 Other frontotemporal dementia: Secondary | ICD-10-CM

## 2023-10-14 DIAGNOSIS — F0280 Dementia in other diseases classified elsewhere without behavioral disturbance: Secondary | ICD-10-CM

## 2023-10-14 DIAGNOSIS — S0003XD Contusion of scalp, subsequent encounter: Secondary | ICD-10-CM | POA: Diagnosis not present

## 2023-10-14 NOTE — Progress Notes (Signed)
 Subjective  CC:  Chief Complaint  Patient presents with   Hospitalization Follow-up    09/30/2023 (7 hours) [facility] at [facility]      Fall    HPI: Rebecca Hubbard is a 83 y.o. female who presents to the office today to address the problems listed above in the chief complaint. Discussed the use of AI scribe software for clinical note transcription with the patient, who gave verbal consent to proceed.  History of Present Illness Rebecca Hubbard is an 83 year old female who presents for a post-fall checkup after sustaining a head injury. She is accompanied by her daughter.  She experienced a fall in her kitchen after slipping on a pillow or sheet that her dog had moved. She landed backwards, hitting her head, which resulted in four or five stitches. She did not lose consciousness during the fall. A CT scan was performed, which showed no bleeding or concussion.  Since the fall, she feels lethargic and experiences dizziness, particularly in the mornings and at night. No headaches, neck stiffness, double vision, or true spinning sensations. Her arms and legs are functioning normally. She describes feeling 'a little off' but not significantly impaired.  The wound on her head has been bleeding slightly at night for the past two to three days, possibly due to scratching.  She takes memantine , calcium , and pantoprazole . There was a recent change in the timing of her memantine  dose from morning to night, which coincided with her symptoms of dizziness and lethargy. She typically goes to bed between 10 and 11 PM and wakes up before 10 AM, although she can sleep later if not awakened.   Assessment  1. Fall, subsequent encounter   2. Scalp hematoma, subsequent encounter   3. Frontotemporal dementia [facility])      Plan  Assessment and Plan Assessment & Plan Post-fall head injury with hematoma Head injury with hematoma from fall. No intracranial  bleeding or concussion. Wound slightly oozing, no infection. Risk of skin ulceration due to hematoma. - Apply paper tape and gauze to wound at night. - Use clean, wet-to-dry dressings if wound becomes too moist. - Apply petrolatum to aid healing. - Advise against manipulating wound. - Monitor for increased oozing or ulceration. - Encourage rest if lethargic.  Dizziness and lethargy possibly related to memantine  Dizziness and lethargy likely related to memantine  timing, not fall. Symptoms improve by midday. - Administer memantine  at 8 PM. - Monitor for changes in dizziness and lethargy with adjusted timing. - daughter does not feel it is due to concussion or fall since sxs started prior to fall  Follow up: prn No orders of the defined types were placed in this encounter.  No orders of the defined types were placed in this encounter.    I reviewed the patients updated PMH, FH, and SocHx.  Patient Active Problem List   Diagnosis Date Noted   TIA (transient ischemic attack) 06/09/2023    Priority: High   Renal malignant neoplasm, left (HCC) 11/29/2021    Priority: High   Frontotemporal dementia (HCC) 06/15/2020    Priority: High   Depression, recurrent (HCC) 05/25/2018    Priority: High   Mixed hyperlipidemia 03/08/2017    Priority: High   Essential hypertension 10/12/2010    Priority: High   Hypothyroidism due to acquired atrophy of thyroid  10/12/2010    Priority: High   Glaucoma 05/25/2018    Priority: Medium    Aortic stenosis, mild 09/10/2017  Priority: Medium    Chronic tophaceous gout 07/08/2017    Priority: Medium    LPRD (laryngopharyngeal reflux disease) 04/18/2015    Priority: Medium    Moderate persistent asthma 04/18/2015    Priority: Medium    Colon adenoma 11/03/2013    Priority: Medium    Osteoarthritis 10/12/2010    Priority: Medium    Statin intolerance 05/25/2018    Priority: Low   Urge incontinence of urine 07/08/2017    Priority: Low   Other  allergic rhinitis 04/18/2015    Priority: Low   Current Meds  Medication Sig   albuterol  (VENTOLIN  HFA) 108 (90 Base) MCG/ACT inhaler Inhale 2 puffs into the lungs every 6 (six) hours as needed for wheezing or shortness of breath.    calcium  carbonate (OS-CAL) 600 MG TABS tablet Take 600 mg by mouth daily.   clopidogrel  (PLAVIX ) 75 MG tablet Take 1 tablet (75 mg total) by mouth daily.   DULERA  200-5 MCG/ACT AERO USE 2 PUFFS WITH SPACER EVERY 12 HOURS TO PREVENT COUGH OR WHEEZE, RINSE, GARGLE AND SPIT AFTER USE   escitalopram  (LEXAPRO ) 20 MG tablet TAKE 1 TABLET BY MOUTH EVERY DAY   ezetimibe  (ZETIA ) 10 MG tablet TAKE 1 TABLET BY MOUTH EVERY DAY   fexofenadine (ALLEGRA) 180 MG tablet Take 180 mg by mouth as needed for allergies.   latanoprost  (XALATAN ) 0.005 % ophthalmic solution Place 1 drop into both eyes at bedtime.   levothyroxine  (SYNTHROID ) 88 MCG tablet TAKE 1 TABLET BY MOUTH EVERY DAY   meloxicam (MOBIC) 15 MG tablet Take 15 mg by mouth daily.   memantine  (NAMENDA  XR) 28 MG CP24 24 hr capsule TAKE 1 CAPSULE BY MOUTH DAILY.   rosuvastatin  (CRESTOR ) 5 MG tablet Take 1 tablet (5 mg total) by mouth at bedtime.   triamterene -hydrochlorothiazide  (MAXZIDE -25) 37.5-25 MG tablet TAKE 1 TABLET BY MOUTH EVERY DAY   Allergies: Patient is allergic to penicillins, sulfa antibiotics, aspirin, and atorvastatin. Family History: Patient family history includes Alzheimer's disease in her father; Cancer in her sister; Stroke in her mother. Social History:  Patient  reports that she quit smoking about 43 years ago. Her smoking use included cigarettes. She has never used smokeless tobacco. She reports that she does not currently use alcohol after a past usage of about 5.0 standard drinks of alcohol per week. She reports that she does not use drugs.  Review of Systems: Constitutional: Negative for fever malaise or anorexia Cardiovascular: negative for chest pain Respiratory: negative for SOB or  persistent cough Gastrointestinal: negative for abdominal pain  Objective  Vitals: BP 134/83   Pulse 72   Temp 97.7 F (36.5 C)   Ht 4' 10 (1.473 m)   Wt 176 lb 6.4 oz (80 kg)   SpO2 100%   BMI 36.87 kg/m  General: no acute distress , A&Ox3 Post parietal scalp with healing hematoma.  Pt appears well, at baseline.  Commons side effects, risks, benefits, and alternatives for medications and treatment plan prescribed today were discussed, and the patient expressed understanding of the given instructions. Patient is instructed to call or message via MyChart if he/she has any questions or concerns regarding our treatment plan. No barriers to understanding were identified. We discussed Red Flag symptoms and signs in detail. Patient expressed understanding regarding what to do in case of urgent or emergency type symptoms.  Medication list was reconciled, printed and provided to the patient in AVS. Patient instructions and summary information was reviewed with the patient as documented  in the AVS. This note was prepared with assistance of Dragon voice recognition software. Occasional wrong-word or sound-a-like substitutions may have occurred due to the inherent limitations of voice recognition software

## 2023-10-14 NOTE — Patient Instructions (Addendum)
 Please return in 3-4 months for your annual complete physical; please come fasting.  For follow up on chronic medical conditions   If you have any questions or concerns, please don't hesitate to send me a message via MyChart or call the office at [phone]. Thank you for visiting with us  today! It's our pleasure caring for you.

## 2023-10-20 ENCOUNTER — Inpatient Hospital Stay: Admitting: Family Medicine

## 2023-11-14 ENCOUNTER — Other Ambulatory Visit: Payer: Self-pay | Admitting: Family Medicine

## 2023-11-26 ENCOUNTER — Other Ambulatory Visit: Payer: Self-pay | Admitting: Urology

## 2023-11-26 DIAGNOSIS — D49512 Neoplasm of unspecified behavior of left kidney: Secondary | ICD-10-CM
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
## 2023-12-05 ENCOUNTER — Encounter: Payer: Self-pay | Admitting: Adult Health

## 2023-12-05 ENCOUNTER — Ambulatory Visit (INDEPENDENT_AMBULATORY_CARE_PROVIDER_SITE_OTHER): Payer: Medicare Other | Admitting: Adult Health

## 2023-12-05 VITALS — BP 126/65 | HR 76 | Ht <= 58 in | Wt 180.4 lb

## 2023-12-05 DIAGNOSIS — F0280 Dementia in other diseases classified elsewhere without behavioral disturbance: Secondary | ICD-10-CM

## 2023-12-05 DIAGNOSIS — G3109 Other frontotemporal dementia: Secondary | ICD-10-CM | POA: Diagnosis not present

## 2023-12-05 NOTE — Patient Instructions (Signed)
 Your Plan:  Continue Namenda  Memory score was stable today I will discuss medication options with Dr. Ines Will follow back up with you next week If your symptoms worsen or you develop new symptoms please let us  know.   Thank you for coming to see us  at [facility]. I hope we have been able to provide you high quality care today.  You may receive a patient satisfaction survey over the next few weeks. We would appreciate your feedback and comments so that we may continue to improve ourselves and the health of our patients.

## 2023-12-09 ENCOUNTER — Other Ambulatory Visit

## 2023-12-13 ENCOUNTER — Ambulatory Visit
Admission: RE | Admit: 2023-12-13 | Discharge: 2023-12-13 | Disposition: A | Discharge status: Home / Self Care | Source: Ambulatory Visit | Attending: Urology | Admitting: Urology

## 2023-12-13 DIAGNOSIS — D49512 Neoplasm of unspecified behavior of left kidney: Secondary | ICD-10-CM

## 2023-12-13 MED ORDER — GADOPICLENOL 0.5 MMOL/ML IV SOLN
9.0000 mL | Freq: Once | INTRAVENOUS | Status: AC | PRN
  Administered 2023-12-13: 9 mL via INTRAVENOUS

## 2023-12-17 ENCOUNTER — Telehealth: Payer: Self-pay | Admitting: *Deleted

## 2023-12-17 NOTE — Telephone Encounter (Signed)
 Called pt's daughter [name] (on [place]) and LVM (ok per DPR) informing her of Megan's message below. Left office number and hours for her to call us  back and let us  know what she thinks.   Sherryl Bouchard, NP  P [facility] Calls Please call patient's daughter and let her know that Dr. Darleen also does not have a lot of experience with rexulti. She is amenable to prescribing if needed.  However she feels that trazodone may carry less risk and can be used in the place of Lexapro .  She can also try low-dose melatonin an hour or 2 prior to sundowning.

## 2023-12-29 ENCOUNTER — Encounter: Payer: Self-pay | Admitting: Family Medicine

## 2024-01-01 ENCOUNTER — Emergency Department (HOSPITAL_COMMUNITY)

## 2024-01-01 ENCOUNTER — Emergency Department (HOSPITAL_COMMUNITY)
Admission: EM | Admit: 2024-01-01 | Discharge: 2024-01-01 | Disposition: A | Discharge status: Home / Self Care | Attending: Emergency Medicine | Admitting: Emergency Medicine

## 2024-01-01 ENCOUNTER — Encounter (HOSPITAL_COMMUNITY): Payer: Self-pay

## 2024-01-01 ENCOUNTER — Other Ambulatory Visit: Payer: Self-pay

## 2024-01-01 DIAGNOSIS — E871 Hypo-osmolality and hyponatremia: Secondary | ICD-10-CM | POA: Diagnosis not present

## 2024-01-01 DIAGNOSIS — Z8673 Personal history of transient ischemic attack (TIA), and cerebral infarction without residual deficits: Secondary | ICD-10-CM | POA: Diagnosis not present

## 2024-01-01 DIAGNOSIS — E860 Dehydration: Secondary | ICD-10-CM

## 2024-01-01 DIAGNOSIS — R4182 Altered mental status, unspecified: Secondary | ICD-10-CM | POA: Insufficient documentation

## 2024-01-01 DIAGNOSIS — F0390 Unspecified dementia without behavioral disturbance: Secondary | ICD-10-CM | POA: Insufficient documentation

## 2024-01-01 DIAGNOSIS — R5383 Other fatigue: Secondary | ICD-10-CM | POA: Diagnosis present

## 2024-01-01 DIAGNOSIS — D649 Anemia, unspecified: Secondary | ICD-10-CM | POA: Insufficient documentation

## 2024-01-01 LAB — URINALYSIS, W/ REFLEX TO CULTURE (INFECTION SUSPECTED)
Bilirubin Urine: NEGATIVE
Glucose, UA: NEGATIVE mg/dL
Hgb urine dipstick: NEGATIVE
Ketones, ur: NEGATIVE mg/dL
Leukocytes,Ua: NEGATIVE
Nitrite: NEGATIVE
Protein, ur: NEGATIVE mg/dL
Specific Gravity, Urine: 1.011 (ref 1.005–1.030)
pH: 7 (ref 5.0–8.0)

## 2024-01-01 LAB — CBC WITH DIFFERENTIAL/PLATELET
Abs Immature Granulocytes: 0.02 K/uL (ref 0.00–0.07)
Basophils Absolute: 0 K/uL (ref 0.0–0.1)
Basophils Relative: 1 %
Eosinophils Absolute: 0.1 K/uL (ref 0.0–0.5)
Eosinophils Relative: 2 %
HCT: 34.1 % — ABNORMAL LOW (ref 36.0–46.0)
Hemoglobin: 11.3 g/dL — ABNORMAL LOW (ref 12.0–15.0)
Immature Granulocytes: 1 %
Lymphocytes Relative: 21 %
Lymphs Abs: 0.9 K/uL (ref 0.7–4.0)
MCH: 27.9 pg (ref 26.0–34.0)
MCHC: 33.1 g/dL (ref 30.0–36.0)
MCV: 84.2 fL (ref 80.0–100.0)
Monocytes Absolute: 0.5 K/uL (ref 0.1–1.0)
Monocytes Relative: 12 %
Neutro Abs: 2.7 K/uL (ref 1.7–7.7)
Neutrophils Relative %: 63 %
Platelets: 219 K/uL (ref 150–400)
RBC: 4.05 MIL/uL (ref 3.87–5.11)
RDW: 13.5 % (ref 11.5–15.5)
WBC: 4.2 K/uL (ref 4.0–10.5)
nRBC: 0 % (ref 0.0–0.2)

## 2024-01-01 LAB — I-STAT CHEM 8, ED
BUN: 12 mg/dL (ref 8–23)
Calcium, Ion: 1.06 mmol/L — ABNORMAL LOW (ref 1.15–1.40)
Chloride: 89 mmol/L — ABNORMAL LOW (ref 98–111)
Creatinine, Ser: 0.8 mg/dL (ref 0.44–1.00)
Glucose, Bld: 98 mg/dL (ref 70–99)
HCT: 34 % — ABNORMAL LOW (ref 36.0–46.0)
Hemoglobin: 11.6 g/dL — ABNORMAL LOW (ref 12.0–15.0)
Potassium: 3.6 mmol/L (ref 3.5–5.1)
Sodium: 128 mmol/L — ABNORMAL LOW (ref 135–145)
TCO2: 29 mmol/L (ref 22–32)

## 2024-01-01 MED ORDER — SODIUM CHLORIDE 0.9 % IV BOLUS
1000.0000 mL | Freq: Once | INTRAVENOUS | Status: AC
  Administered 2024-01-01: 1000 mL via INTRAVENOUS

## 2024-01-01 NOTE — Discharge Instructions (Signed)
 Please eat and drink as well as you can for the next couple days.  Please follow-up with your family doctor in the office

## 2024-01-01 NOTE — ED Provider Notes (Signed)
 [facility] AT [facility] Provider Note   CSN: [id number] Arrival date & time: 01/01/24  1235     Patient presents with: Altered Mental Status and Fatigue   [facility] is a 83 y.o. female.   Patient presents due to altered mental status.  Daughter at bedside states that patient has been declining in mental status for the past 2 weeks.  Patient has history of frontotemporal dementia and TIA 1 year ago.  Daughter notes patient has been having more difficulty with ambulation and she found the patient on the ground next to her bed twice recently.  States yesterday she found the patient in bed with her soiled briefs on the chair next to her.  Does not believe patient hit her head, states patient had landed on her L hip and was complaining of hip pain a few days ago.  Patient takes Namenda , no antipsychotics.  No recent changes in medicines.  Daughter suspects patient may have tardive dyskinesia due to frequent facial movements, has noticed these in the past few weeks.  Patient denies pain anywhere in her body, denies dysuria.   Altered Mental Status      Prior to Admission medications   Medication Sig Start Date End Date Taking? Authorizing Provider  albuterol  (VENTOLIN  HFA) 108 (90 Base) MCG/ACT inhaler Inhale 2 puffs into the lungs every 6 (six) hours as needed for wheezing or shortness of breath.  05/30/10   [provider]  calcium  carbonate (OS-CAL) 600 MG TABS tablet Take 600 mg by mouth daily.    [provider]  clopidogrel  (PLAVIX ) 75 MG tablet Take 1 tablet (75 mg total) by mouth daily. 09/09/23   Jodie Lavern CROME, MD  DULERA  200-5 MCG/ACT AERO USE 2 PUFFS WITH SPACER EVERY 12 HOURS TO PREVENT COUGH OR WHEEZE, RINSE, GARGLE AND SPIT AFTER USE 04/18/15   Kozlow, Camellia PARAS, MD  escitalopram  (LEXAPRO ) 20 MG tablet TAKE 1 TABLET BY MOUTH EVERY DAY 07/20/23   Jodie Lavern CROME, MD  ezetimibe  (ZETIA ) 10 MG tablet TAKE 1 TABLET BY MOUTH  EVERY DAY 01/13/23   Jodie Lavern CROME, MD  latanoprost  (XALATAN ) 0.005 % ophthalmic solution Place 1 drop into both eyes at bedtime. 10/06/13   [provider]  levothyroxine  (SYNTHROID ) 88 MCG tablet TAKE 1 TABLET BY MOUTH EVERY DAY 11/14/23   Jodie Lavern CROME, MD  memantine  (NAMENDA  XR) 28 MG CP24 24 hr capsule TAKE 1 CAPSULE BY MOUTH DAILY. 05/06/23   Millikan, Megan, NP  rosuvastatin  (CRESTOR ) 5 MG tablet Take 1 tablet (5 mg total) by mouth at bedtime. 01/21/23   Jodie Lavern CROME, MD  triamterene -hydrochlorothiazide  (MAXZIDE -25) 37.5-25 MG tablet TAKE 1 TABLET BY MOUTH EVERY DAY 05/12/23   Jodie Lavern CROME, MD    Allergies: Penicillins, Sulfa antibiotics, Aspirin, and Atorvastatin    Review of Systems  Updated Vital Signs There were no vitals taken for this visit.  Physical Exam Vitals and nursing note reviewed.  Constitutional:      General: She is not in acute distress.    Appearance: Normal appearance. She is not ill-appearing.  HENT:     Head: Normocephalic and atraumatic.     Mouth/Throat:     Mouth: Mucous membranes are moist.  Eyes:     Extraocular Movements: Extraocular movements intact.     Conjunctiva/sclera: Conjunctivae normal.  Cardiovascular:     Rate and Rhythm: Normal rate and regular rhythm.     Heart sounds: Normal heart sounds.  Pulmonary:     Effort: Pulmonary effort is normal.     Breath sounds: Normal breath sounds.  Abdominal:     General: Abdomen is flat. Bowel sounds are normal.     Palpations: Abdomen is soft.     Tenderness: There is no abdominal tenderness.  Skin:    General: Skin is warm and dry.     Findings: No bruising or lesion.     Comments: No hematoma noted on left hip  Neurological:     Mental Status: She is alert.     Cranial Nerves: Cranial nerves 2-12 are intact. No dysarthria or facial asymmetry.     Sensory: Sensation is intact.     Comments: Frequent facial movements (opening mouth, sticking out tongue)  Psychiatric:         Speech: Speech normal.        Behavior: Behavior is cooperative.        Cognition and Memory: Cognition is impaired.     (all labs ordered are listed, but only abnormal results are displayed) Labs Reviewed - No data to display  EKG: None  Radiology: No results found.   Procedures   Medications Ordered in the ED - No data to display                                  Medical Decision Making 83 year old female with PMH of frontotemporal dementia who presents with altered mental status over the past 2 weeks.  Differential includes CVA/TIA, head trauma from fall, UTI or other infectious process, progression of dementia.  VSS with benign neurologic exam. Will obtain blood work, UA, CT head (given history of multiple falls).  Labs revealed hyponatremia to 128, CBC with mild anemia to 11.6.  Head CT pending. Sign out given to oncoming ED provider.  Amount and/or Complexity of Data Reviewed Labs: ordered. Radiology: ordered.       Final diagnoses:  None    ED Discharge Orders     None          Rebecca Song, MD 01/01/24 1501    Rebecca Faden, MD 01/02/24 0800

## 2024-01-01 NOTE — ED Triage Notes (Addendum)
 Pt bib EMS from home frontal lobe dementia. Daughter MPOA, past week pt has steadily declined. Increased in falls, lethargy, and possible TIA today. Pt is taking off soiled brief and leaving in on the chair, which is not at baseline. TIA non-symptomatic upon EMS arrival. Daughter wants pt checked to see if it was TIA or Dementia getting worse. CBG 162

## 2024-01-01 NOTE — ED Provider Notes (Signed)
 I saw and evaluated the patient, reviewed the resident's note and I agree with the findings and plan.      83 year old female with history of dementia presents with decline in status over 2 weeks.  History is provided by patient and her daughter.  On exam, she is alert and oriented x 3 at this time.  Will check labs and perform head CT.   [name], MD 01/01/24 1355

## 2024-01-01 NOTE — ED Provider Notes (Signed)
 Received patient in turnover from Dr. Dasie.  Please see their note for further details of Hx, PE.  Briefly patient is a 83 y.o. female with a Altered Mental Status and Fatigue .  Awaiting UA and reassessment.  On my independent interpretation of the labs the patient is having treatment hypochloremia, perhaps consistent with dehydration.  Will give bolus of IV fluids.  I discussed results with patient and family.  Patient is excited to go home.  Will have her follow-up with family doctor in office.    Emil Share, DO 01/01/24 [phone]

## 2024-01-05 ENCOUNTER — Telehealth: Payer: Self-pay

## 2024-01-05 NOTE — Telephone Encounter (Signed)
 Transition Care Management Unsuccessful Follow-up Telephone Call  Date of discharge and from where:  01/01/24 [facility]  Attempts:  1st Attempt  Reason for unsuccessful TCM follow-up call:  Left voice message for patient attempting to complete [facility] call regarding recent ED visit. Advised to return call and schedule ED follow up with PCP. Please schedule patient for ED follow up as instructed in discharge.

## 2024-01-06 ENCOUNTER — Other Ambulatory Visit: Payer: Self-pay | Admitting: Family Medicine

## 2024-02-11 ENCOUNTER — Ambulatory Visit: Admitting: Family Medicine

## 2024-02-15 ENCOUNTER — Other Ambulatory Visit: Payer: Self-pay | Admitting: Family Medicine

## 2024-02-15 DIAGNOSIS — E782 Mixed hyperlipidemia: Secondary | ICD-10-CM

## 2024-03-02 ENCOUNTER — Ambulatory Visit: Admitting: Family Medicine

## 2024-03-03 ENCOUNTER — Other Ambulatory Visit: Payer: Self-pay | Admitting: Adult Health

## 2024-05-13 ENCOUNTER — Encounter: Payer: Self-pay | Admitting: Family Medicine

## 2024-05-14 MED ORDER — DULERA 200-5 MCG/ACT IN AERO: INHALATION_SPRAY | RESPIRATORY_TRACT | 5 refills | Status: AC

## 2024-05-16 IMAGING — MR MR ABDOMEN WO/W CM
16 series · 48 of 48 positions shown · IV contrast (multihance)
Comparison: CT abdomen and pelvis 08/22/2021

CLINICAL DATA: Left renal mass evaluation

EXAM:
MRI ABDOMEN WITHOUT AND WITH CONTRAST
TECHNIQUE: Multiplanar multisequence MR imaging of the abdomen was performed
both before and after the administration of intravenous contrast.
CONTRAST:  18mL MULTIHANCE GADOBENATE DIMEGLUMINE 529 MG/ML IV SOLN

[Series 2: T2 · coronal · 5.4mm · 1.56mm/px · 2 of 35 slices shown (1 of 2)]
[im 1/35]
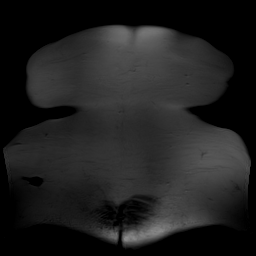
[im 35/35]
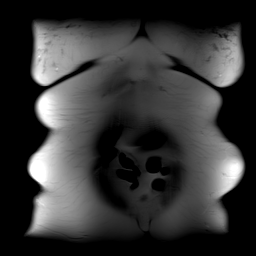

[Series 4: T1 · axial · 5.5mm · 0.78mm/px · z∈[-128,+90]mm · 4 of 68 slices shown]
[im 1/68]
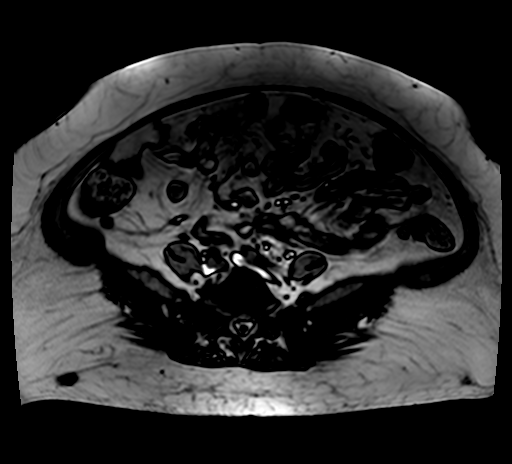
[im 23/68]
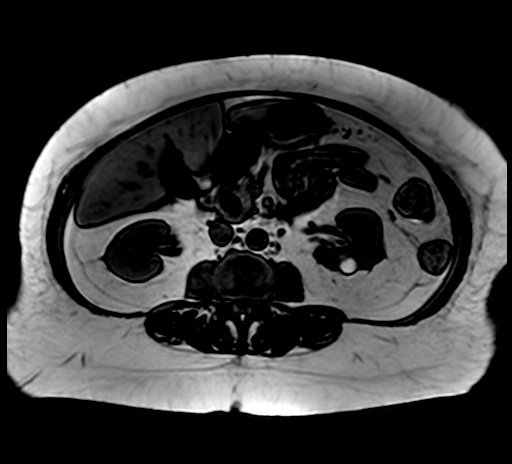
[im 45/68]
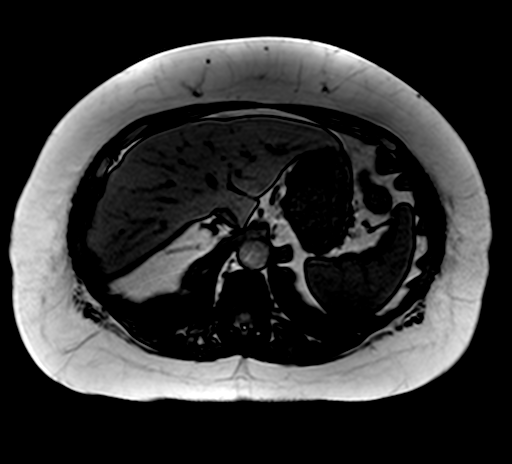
[im 68/68]
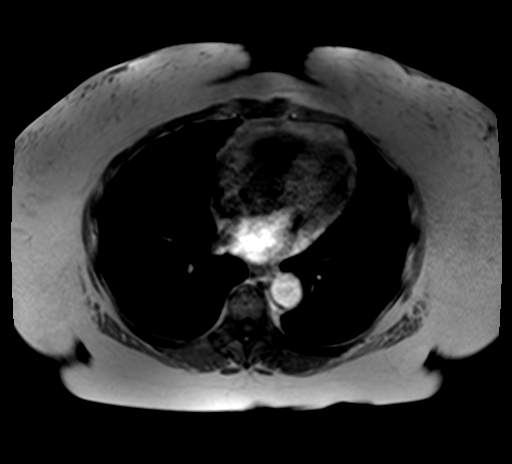

[Series 5: T2 fat-sat · axial · 5.5mm · 1.56mm/px · z∈[-136,+108]mm · 3 of 38 slices shown]
[im 1/38]
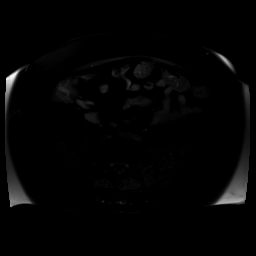
[im 19/38]
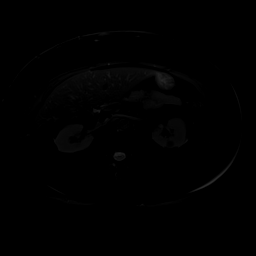
[im 38/38]
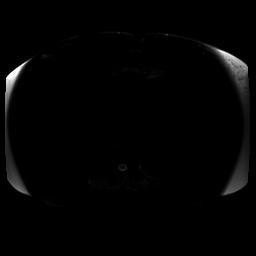

[Series 6: DWI · axial · 5.5mm · 1.49mm/px · z∈[-132,+105]mm · 6 of 111 slices shown (1 of 2)]
[im 1/111]
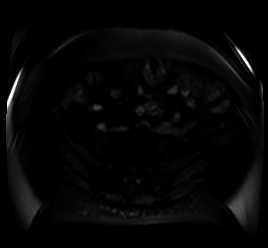
[im 23/111]
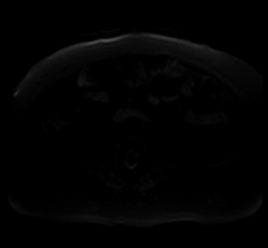
[im 45/111]
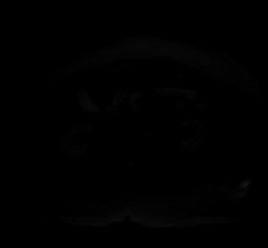
[im 67/111]
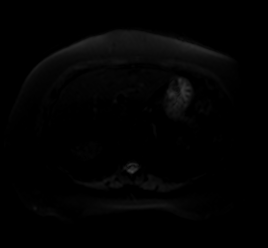
[im 89/111]
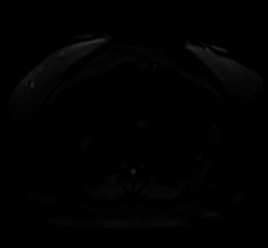
[im 111/111]
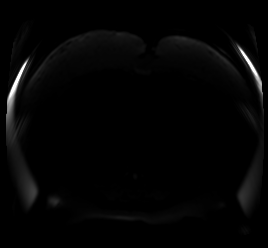

[Series 7: DWI · axial · 5.5mm · 1.49mm/px · z∈[-132,+105]mm · 2 of 37 slices shown (2 of 2)]
[im 1/37]
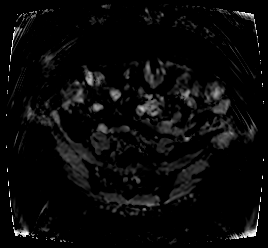
[im 37/37]
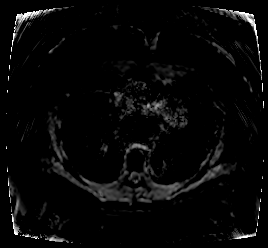

[Series 8: T1 dynamic · axial · non-contrast · 3.0mm · 1.25mm/px · z∈[-143,+94]mm · 3 of 80 slices shown]
[im 1/80]
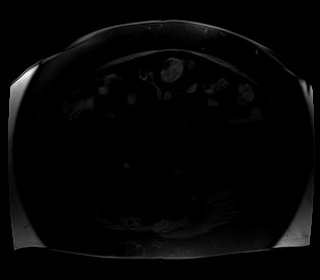
[im 40/80]
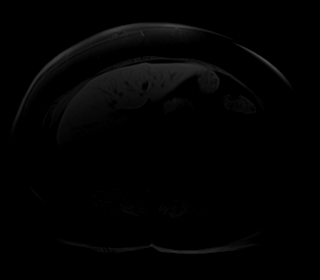
[im 80/80]
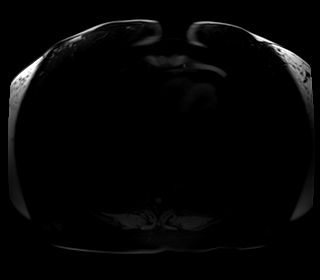

[Series 9: T1 dynamic post-contrast · axial · 3.0mm · 1.25mm/px · z∈[-143,+94]mm · 3 of 80 slices shown (1 of 9)]
[im 1/80]
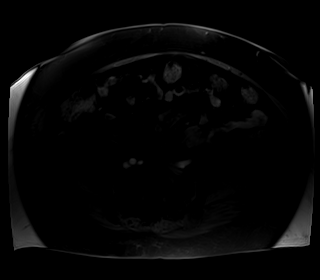
[im 40/80]
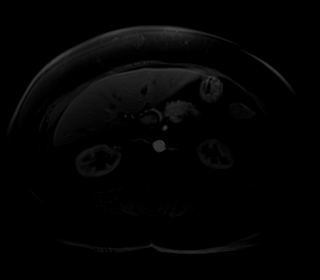
[im 80/80]
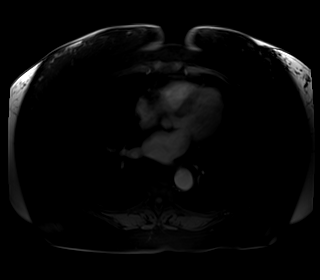

[Series 10: T1 dynamic post-contrast · axial · 3.0mm · 1.25mm/px · z∈[-143,+94]mm · 3 of 80 slices shown (2 of 9)]
[im 1/80]
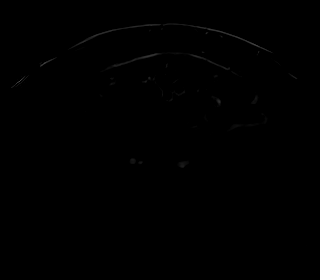
[im 40/80]
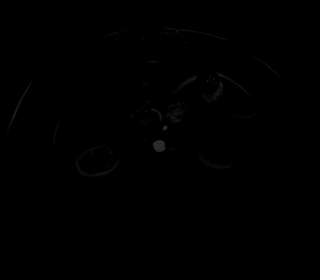
[im 80/80]
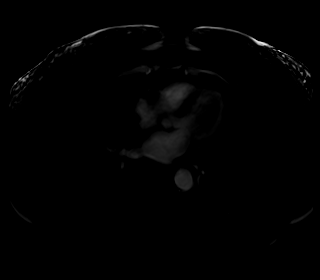

[Series 11: T1 dynamic post-contrast · axial · 3.0mm · 1.25mm/px · z∈[-143,+94]mm · 3 of 80 slices shown (3 of 9)]
[im 1/80]
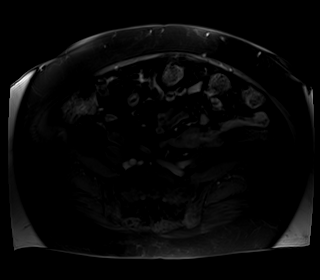
[im 40/80]
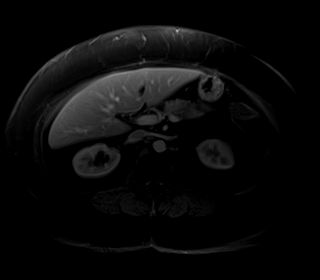
[im 80/80]
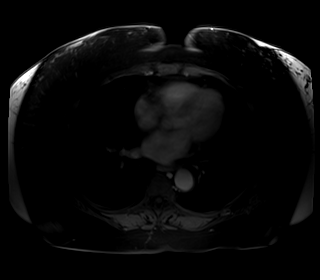

[Series 12: T1 dynamic post-contrast · axial · 3.0mm · 1.25mm/px · z∈[-143,+94]mm · 3 of 80 slices shown (4 of 9)]
[im 1/80]
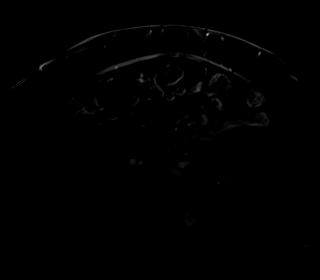
[im 40/80]
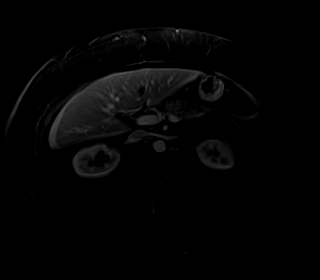
[im 80/80]
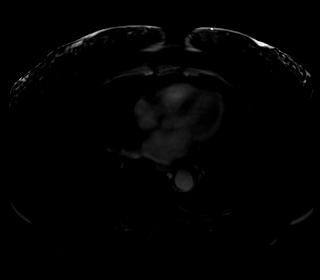

[Series 13: T1 dynamic post-contrast · axial · 3.0mm · 1.25mm/px · z∈[-143,+94]mm · 3 of 80 slices shown (5 of 9)]
[im 1/80]
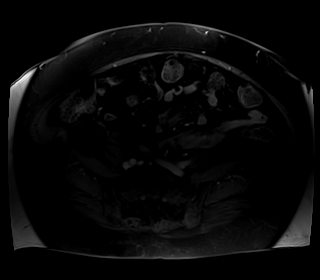
[im 40/80]
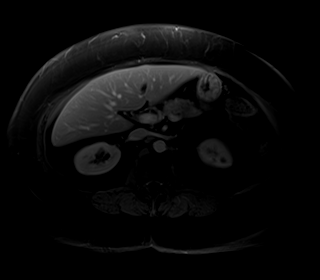
[im 80/80]
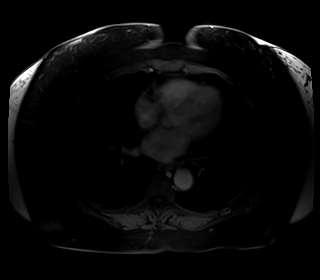

[Series 14: T1 dynamic post-contrast · axial · 3.0mm · 1.25mm/px · z∈[-143,+94]mm · 3 of 80 slices shown (6 of 9)]
[im 1/80]
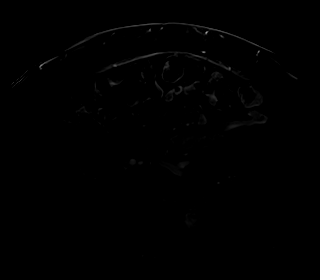
[im 40/80]
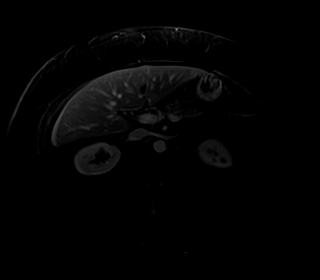
[im 80/80]
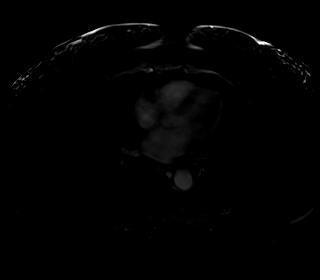

[Series 15: T1 dynamic post-contrast · coronal · 3.0mm · 1.25mm/px · 3 of 80 slices shown (7 of 9)]
[im 1/80]
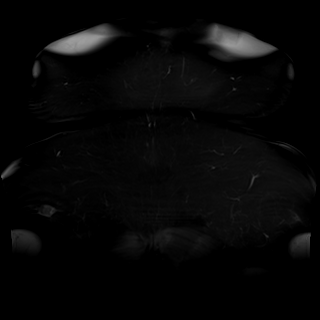
[im 40/80]
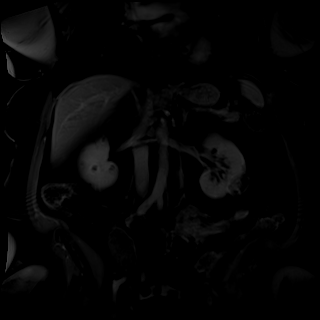
[im 80/80]
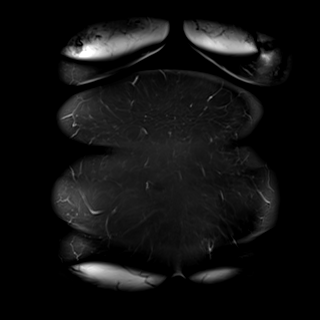

[Series 16: T2 · axial · 6.0mm · 1.19mm/px · 1 of 34 slices shown (2 of 2)]
[im 1/34]
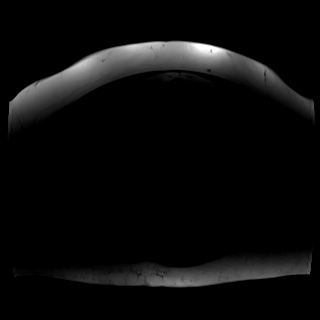

[Series 17: T1 dynamic post-contrast · axial · 3.0mm · 1.25mm/px · z∈[-143,+94]mm · 3 of 80 slices shown (8 of 9)]
[im 1/80]
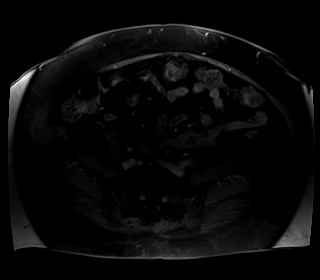
[im 40/80]
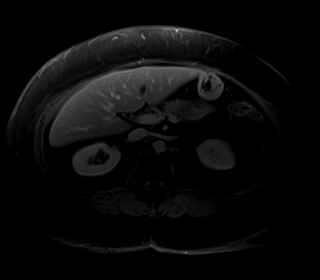
[im 80/80]
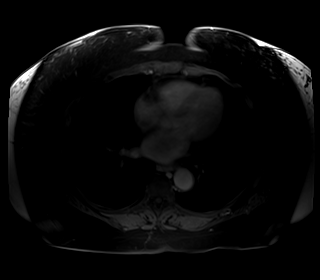

[Series 18: T1 dynamic post-contrast · axial · 3.0mm · 1.25mm/px · z∈[-143,+94]mm · 3 of 80 slices shown (9 of 9)]
[im 1/80]
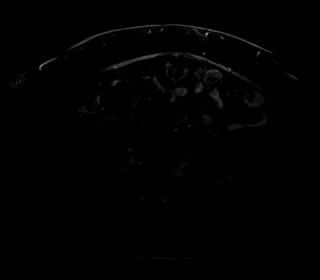
[im 40/80]
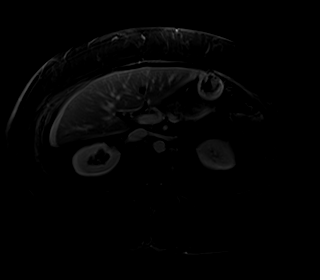
[im 80/80]
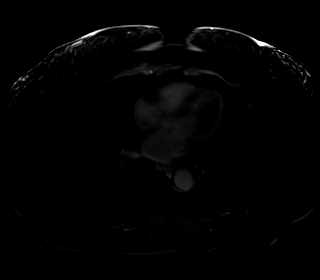

[48 of 48 positions shown; findings below may reference images not displayed]

FINDINGS: Lower chest: No acute findings.

Hepatobiliary: Liver is normal in size and contour with no
suspicious mass identified. Cholelithiasis with no gallbladder wall
thickening or surrounding edema. No biliary ductal dilatation
identified.

Pancreas: No mass, inflammatory changes, or other parenchymal
abnormality identified.

Spleen:  Within normal limits in size and appearance.

Adrenals/Urinary Tract: Adrenal glands appear normal. There is a
lobulated, complex, predominantly cystic mass again seen at the
lateral lower pole of the left kidney which measures up to 3.5 x
cm in axial dimensions. The mass demonstrates enhancing septation
centrally as well as a triangular shaped area of solid enhancement
medially which measures 1.7 x 1.5 cm. Several small hyperintense T1
signal hemorrhagic/proteinaceous cysts are visualized bilaterally
which measure up to 12 mm in the mid right kidney and 14 mm at the
posterior left kidney. No definite enhancing renal mass visualized
in the right kidney. No hydronephrosis identified bilaterally.

Stomach/Bowel: Colonic diverticulosis. No evidence of bowel
obstruction.

Vascular/Lymphatic: No pathologically enlarged lymph nodes
identified. Renal veins appear normal. No abdominal aortic aneurysm
demonstrated.

Other:  No ascites.

Musculoskeletal: No suspicious bone lesions identified.
IMPRESSION: 1. Complex mass at the lateral left kidney as described which
includes a solid enhancing component, consistent with neoplasm.
2. Several small hemorrhagic/proteinaceous renal cysts bilaterally.
3. Cholelithiasis.
4. Colonic diverticulosis.
5. No lymphadenopathy.

## 2024-08-03 ENCOUNTER — Ambulatory Visit: Admitting: Adult Health
